# Patient Record
Sex: Female | Born: 1980 | Race: White | Hispanic: No | Marital: Married | Smoking: Former smoker
Health system: Southern US, Community
[De-identification: ages and names within clinical notes are randomized; demographics above are authoritative.]

## PROBLEM LIST (undated history)

## (undated) DIAGNOSIS — R0602 Shortness of breath: Secondary | ICD-10-CM

## (undated) DIAGNOSIS — G4730 Sleep apnea, unspecified: Secondary | ICD-10-CM

## (undated) DIAGNOSIS — D649 Anemia, unspecified: Secondary | ICD-10-CM

## (undated) DIAGNOSIS — F419 Anxiety disorder, unspecified: Secondary | ICD-10-CM

## (undated) DIAGNOSIS — K219 Gastro-esophageal reflux disease without esophagitis: Secondary | ICD-10-CM

## (undated) DIAGNOSIS — Z9884 Bariatric surgery status: Secondary | ICD-10-CM

## (undated) DIAGNOSIS — E669 Obesity, unspecified: Secondary | ICD-10-CM

## (undated) DIAGNOSIS — G2581 Restless legs syndrome: Secondary | ICD-10-CM

## (undated) DIAGNOSIS — K589 Irritable bowel syndrome without diarrhea: Secondary | ICD-10-CM

## (undated) DIAGNOSIS — R5383 Other fatigue: Secondary | ICD-10-CM

## (undated) DIAGNOSIS — E739 Lactose intolerance, unspecified: Secondary | ICD-10-CM

## (undated) DIAGNOSIS — K5900 Constipation, unspecified: Secondary | ICD-10-CM

## (undated) DIAGNOSIS — E559 Vitamin D deficiency, unspecified: Secondary | ICD-10-CM

## (undated) DIAGNOSIS — R600 Localized edema: Secondary | ICD-10-CM

## (undated) DIAGNOSIS — M549 Dorsalgia, unspecified: Secondary | ICD-10-CM

## (undated) DIAGNOSIS — T7840XA Allergy, unspecified, initial encounter: Secondary | ICD-10-CM

## (undated) DIAGNOSIS — E785 Hyperlipidemia, unspecified: Secondary | ICD-10-CM

## (undated) DIAGNOSIS — M2550 Pain in unspecified joint: Secondary | ICD-10-CM

## (undated) DIAGNOSIS — F32A Depression, unspecified: Secondary | ICD-10-CM

## (undated) HISTORY — DX: Localized edema: R60.0

## (undated) HISTORY — PX: LAPAROSCOPIC GASTRIC BAND REMOVAL WITH LAPAROSCOPIC GASTRIC SLEEVE RESECTION: SHX6498

## (undated) HISTORY — DX: Gastro-esophageal reflux disease without esophagitis: K21.9

## (undated) HISTORY — DX: Allergy, unspecified, initial encounter: T78.40XA

## (undated) HISTORY — DX: Constipation, unspecified: K59.00

## (undated) HISTORY — DX: Pain in unspecified joint: M25.50

## (undated) HISTORY — DX: Bariatric surgery status: Z98.84

## (undated) HISTORY — DX: Other fatigue: R53.83

## (undated) HISTORY — DX: Vitamin D deficiency, unspecified: E55.9

## (undated) HISTORY — PX: COLONOSCOPY WITH ESOPHAGOGASTRODUODENOSCOPY (EGD): SHX5779

## (undated) HISTORY — DX: Anxiety disorder, unspecified: F41.9

## (undated) HISTORY — DX: Depression, unspecified: F32.A

## (undated) HISTORY — DX: Lactose intolerance, unspecified: E73.9

## (undated) HISTORY — PX: WISDOM TOOTH EXTRACTION: SHX21

## (undated) HISTORY — DX: Irritable bowel syndrome, unspecified: K58.9

## (undated) HISTORY — PX: WRIST SURGERY: SHX841

## (undated) HISTORY — DX: Hyperlipidemia, unspecified: E78.5

## (undated) HISTORY — DX: Sleep apnea, unspecified: G47.30

## (undated) HISTORY — PX: TUBAL LIGATION: SHX77

## (undated) HISTORY — DX: Anemia, unspecified: D64.9

## (undated) HISTORY — PX: ENDOMETRIAL ABLATION W/ NOVASURE: SUR434

## (undated) HISTORY — DX: Dorsalgia, unspecified: M54.9

## (undated) HISTORY — DX: Shortness of breath: R06.02

---
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
## 2000-03-29 ENCOUNTER — Other Ambulatory Visit: Admission: RE | Admit: 2000-03-29 | Discharge: 2000-03-29 | Payer: Self-pay | Admitting: Obstetrics and Gynecology

## 2000-05-24 ENCOUNTER — Other Ambulatory Visit: Admission: RE | Admit: 2000-05-24 | Discharge: 2000-05-24 | Payer: Self-pay | Admitting: Obstetrics and Gynecology

## 2000-05-24 ENCOUNTER — Encounter (INDEPENDENT_AMBULATORY_CARE_PROVIDER_SITE_OTHER): Payer: Self-pay

## 2000-11-01 ENCOUNTER — Other Ambulatory Visit: Admission: RE | Admit: 2000-11-01 | Discharge: 2000-11-01 | Payer: Self-pay | Admitting: Obstetrics and Gynecology

## 2001-04-10 ENCOUNTER — Other Ambulatory Visit: Admission: RE | Admit: 2001-04-10 | Discharge: 2001-04-10 | Payer: Self-pay | Admitting: Obstetrics and Gynecology

## 2002-02-20 ENCOUNTER — Emergency Department (HOSPITAL_COMMUNITY): Admission: EM | Admit: 2002-02-20 | Discharge: 2002-02-21 | Payer: Self-pay | Admitting: Emergency Medicine

## 2002-02-21 ENCOUNTER — Encounter: Payer: Self-pay | Admitting: Emergency Medicine

## 2002-04-18 ENCOUNTER — Other Ambulatory Visit: Admission: RE | Admit: 2002-04-18 | Discharge: 2002-04-18 | Payer: Self-pay | Admitting: Obstetrics and Gynecology

## 2003-05-08 ENCOUNTER — Other Ambulatory Visit: Admission: RE | Admit: 2003-05-08 | Discharge: 2003-05-08 | Payer: Self-pay | Admitting: Obstetrics and Gynecology

## 2003-09-15 ENCOUNTER — Ambulatory Visit (HOSPITAL_COMMUNITY): Admission: RE | Admit: 2003-09-15 | Discharge: 2003-09-15 | Payer: Self-pay | Admitting: Obstetrics and Gynecology

## 2003-10-14 ENCOUNTER — Inpatient Hospital Stay (HOSPITAL_COMMUNITY): Admission: AD | Admit: 2003-10-14 | Discharge: 2003-10-15 | Payer: Self-pay | Admitting: Obstetrics and Gynecology

## 2003-12-16 ENCOUNTER — Inpatient Hospital Stay (HOSPITAL_COMMUNITY): Admission: AD | Admit: 2003-12-16 | Discharge: 2003-12-16 | Payer: Self-pay | Admitting: *Deleted

## 2004-01-02 ENCOUNTER — Inpatient Hospital Stay (HOSPITAL_COMMUNITY): Admission: AD | Admit: 2004-01-02 | Discharge: 2004-01-03 | Payer: Self-pay | Admitting: Obstetrics and Gynecology

## 2004-01-03 ENCOUNTER — Inpatient Hospital Stay (HOSPITAL_COMMUNITY): Admission: AD | Admit: 2004-01-03 | Discharge: 2004-01-03 | Payer: Self-pay | Admitting: Obstetrics and Gynecology

## 2004-01-06 ENCOUNTER — Inpatient Hospital Stay (HOSPITAL_COMMUNITY): Admission: AD | Admit: 2004-01-06 | Discharge: 2004-01-06 | Payer: Self-pay | Admitting: Obstetrics and Gynecology

## 2004-01-20 ENCOUNTER — Inpatient Hospital Stay (HOSPITAL_COMMUNITY): Admission: AD | Admit: 2004-01-20 | Discharge: 2004-01-21 | Payer: Self-pay | Admitting: Obstetrics and Gynecology

## 2004-12-27 ENCOUNTER — Other Ambulatory Visit: Admission: RE | Admit: 2004-12-27 | Discharge: 2004-12-27 | Payer: Self-pay | Admitting: Obstetrics and Gynecology

## 2006-05-02 ENCOUNTER — Emergency Department (HOSPITAL_COMMUNITY): Admission: EM | Admit: 2006-05-02 | Discharge: 2006-05-02 | Payer: Self-pay | Admitting: *Deleted

## 2006-08-01 ENCOUNTER — Emergency Department (HOSPITAL_COMMUNITY): Admission: EM | Admit: 2006-08-01 | Discharge: 2006-08-01 | Payer: Self-pay | Admitting: Family Medicine

## 2006-11-18 ENCOUNTER — Emergency Department (HOSPITAL_COMMUNITY): Admission: EM | Admit: 2006-11-18 | Discharge: 2006-11-18 | Payer: Self-pay | Admitting: Emergency Medicine

## 2007-02-08 ENCOUNTER — Emergency Department (HOSPITAL_COMMUNITY): Admission: EM | Admit: 2007-02-08 | Discharge: 2007-02-08 | Payer: Self-pay | Admitting: Emergency Medicine

## 2007-04-20 ENCOUNTER — Ambulatory Visit: Payer: Self-pay | Admitting: *Deleted

## 2007-04-20 ENCOUNTER — Encounter (INDEPENDENT_AMBULATORY_CARE_PROVIDER_SITE_OTHER): Payer: Self-pay | Admitting: Nurse Practitioner

## 2007-04-20 ENCOUNTER — Ambulatory Visit: Payer: Self-pay | Admitting: Family Medicine

## 2007-04-20 LAB — CONVERTED CEMR LAB
ALT: 15 units/L (ref 0–35)
AST: 13 units/L (ref 0–37)
Alkaline Phosphatase: 90 units/L (ref 39–117)
Basophils Absolute: 0 10*3/uL (ref 0.0–0.1)
Basophils Relative: 0 % (ref 0–1)
CO2: 22 meq/L (ref 19–32)
Calcium: 10 mg/dL (ref 8.4–10.5)
Chloride: 105 meq/L (ref 96–112)
Eosinophils Absolute: 0.1 10*3/uL (ref 0.0–0.7)
Eosinophils Relative: 1 % (ref 0–5)
Helicobacter Pylori Antibody-IgG: <0.4
MCHC: 33.5 g/dL (ref 30.0–36.0)
Neutro Abs: 6 10*3/uL (ref 1.7–7.7)
Potassium: 4.4 meq/L (ref 3.5–5.3)
Sodium: 139 meq/L (ref 135–145)
TSH: 1.287 microintl units/mL (ref 0.350–5.50)
WBC: 9 10*3/uL (ref 4.0–10.5)

## 2007-04-25 ENCOUNTER — Ambulatory Visit (HOSPITAL_COMMUNITY): Admission: RE | Admit: 2007-04-25 | Discharge: 2007-04-25 | Payer: Self-pay | Admitting: Family Medicine

## 2007-05-22 ENCOUNTER — Ambulatory Visit: Payer: Self-pay | Admitting: Internal Medicine

## 2007-05-24 ENCOUNTER — Ambulatory Visit: Payer: Self-pay | Admitting: Internal Medicine

## 2007-05-24 ENCOUNTER — Encounter (INDEPENDENT_AMBULATORY_CARE_PROVIDER_SITE_OTHER): Payer: Self-pay | Admitting: Nurse Practitioner

## 2007-05-29 ENCOUNTER — Encounter (INDEPENDENT_AMBULATORY_CARE_PROVIDER_SITE_OTHER): Payer: Self-pay | Admitting: Nurse Practitioner

## 2007-05-29 ENCOUNTER — Ambulatory Visit: Payer: Self-pay | Admitting: Family Medicine

## 2007-05-29 LAB — CONVERTED CEMR LAB
Total CHOL/HDL Ratio: 3.5
Triglycerides: 85 mg/dL (ref <150)

## 2007-06-14 ENCOUNTER — Ambulatory Visit: Payer: Self-pay | Admitting: Family Medicine

## 2007-07-03 ENCOUNTER — Ambulatory Visit: Payer: Self-pay | Admitting: Family Medicine

## 2007-07-03 DIAGNOSIS — R87619 Unspecified abnormal cytological findings in specimens from cervix uteri: Secondary | ICD-10-CM | POA: Insufficient documentation

## 2007-07-03 DIAGNOSIS — R8789 Other abnormal findings in specimens from female genital organs: Secondary | ICD-10-CM | POA: Insufficient documentation

## 2007-07-04 ENCOUNTER — Telehealth: Payer: Self-pay | Admitting: *Deleted

## 2007-07-11 ENCOUNTER — Ambulatory Visit: Payer: Self-pay | Admitting: Internal Medicine

## 2007-07-16 ENCOUNTER — Telehealth: Payer: Self-pay | Admitting: *Deleted

## 2007-07-16 ENCOUNTER — Encounter: Payer: Self-pay | Admitting: Family Medicine

## 2007-07-24 ENCOUNTER — Telehealth: Payer: Self-pay | Admitting: *Deleted

## 2007-08-03 ENCOUNTER — Telehealth: Payer: Self-pay | Admitting: *Deleted

## 2007-08-09 ENCOUNTER — Ambulatory Visit: Payer: Self-pay | Admitting: Internal Medicine

## 2007-08-14 ENCOUNTER — Emergency Department (HOSPITAL_COMMUNITY): Admission: EM | Admit: 2007-08-14 | Discharge: 2007-08-14 | Payer: Self-pay | Admitting: Family Medicine

## 2007-08-17 ENCOUNTER — Ambulatory Visit: Payer: Self-pay | Admitting: Obstetrics & Gynecology

## 2007-08-21 ENCOUNTER — Ambulatory Visit: Payer: Self-pay | Admitting: Internal Medicine

## 2007-09-05 ENCOUNTER — Other Ambulatory Visit: Admission: RE | Admit: 2007-09-05 | Discharge: 2007-09-05 | Payer: Self-pay | Admitting: Obstetrics & Gynecology

## 2007-09-05 ENCOUNTER — Ambulatory Visit: Payer: Self-pay | Admitting: Obstetrics & Gynecology

## 2007-10-05 ENCOUNTER — Ambulatory Visit: Payer: Self-pay | Admitting: *Deleted

## 2007-10-13 ENCOUNTER — Emergency Department (HOSPITAL_COMMUNITY): Admission: EM | Admit: 2007-10-13 | Discharge: 2007-10-13 | Payer: Self-pay | Admitting: Emergency Medicine

## 2007-10-16 ENCOUNTER — Ambulatory Visit (HOSPITAL_COMMUNITY): Admission: RE | Admit: 2007-10-16 | Discharge: 2007-10-16 | Payer: Self-pay | Admitting: Family Medicine

## 2007-10-21 ENCOUNTER — Emergency Department (HOSPITAL_COMMUNITY): Admission: EM | Admit: 2007-10-21 | Discharge: 2007-10-21 | Payer: Self-pay | Admitting: Family Medicine

## 2007-11-14 ENCOUNTER — Emergency Department (HOSPITAL_COMMUNITY): Admission: EM | Admit: 2007-11-14 | Discharge: 2007-11-14 | Payer: Self-pay | Admitting: Family Medicine

## 2007-12-08 ENCOUNTER — Emergency Department (HOSPITAL_COMMUNITY): Admission: EM | Admit: 2007-12-08 | Discharge: 2007-12-08 | Payer: Self-pay | Admitting: Family Medicine

## 2008-01-25 ENCOUNTER — Encounter: Payer: Self-pay | Admitting: Obstetrics & Gynecology

## 2008-01-25 ENCOUNTER — Ambulatory Visit: Payer: Self-pay | Admitting: Obstetrics & Gynecology

## 2008-01-31 ENCOUNTER — Ambulatory Visit: Payer: Self-pay | Admitting: Sports Medicine

## 2008-01-31 DIAGNOSIS — E669 Obesity, unspecified: Secondary | ICD-10-CM | POA: Insufficient documentation

## 2008-01-31 DIAGNOSIS — M25539 Pain in unspecified wrist: Secondary | ICD-10-CM | POA: Insufficient documentation

## 2008-02-01 ENCOUNTER — Encounter: Payer: Self-pay | Admitting: Sports Medicine

## 2008-02-01 ENCOUNTER — Ambulatory Visit: Payer: Self-pay | Admitting: Family Medicine

## 2008-02-01 LAB — CONVERTED CEMR LAB: Glucose, Bld: 95 mg/dL (ref 70–99)

## 2008-02-05 ENCOUNTER — Encounter (INDEPENDENT_AMBULATORY_CARE_PROVIDER_SITE_OTHER): Payer: Self-pay | Admitting: *Deleted

## 2008-02-08 ENCOUNTER — Encounter: Payer: Self-pay | Admitting: Family Medicine

## 2008-02-21 ENCOUNTER — Ambulatory Visit: Payer: Self-pay | Admitting: Obstetrics & Gynecology

## 2008-03-04 ENCOUNTER — Ambulatory Visit: Payer: Self-pay | Admitting: Sports Medicine

## 2008-03-04 DIAGNOSIS — M65839 Other synovitis and tenosynovitis, unspecified forearm: Secondary | ICD-10-CM | POA: Insufficient documentation

## 2008-03-04 DIAGNOSIS — G5600 Carpal tunnel syndrome, unspecified upper limb: Secondary | ICD-10-CM | POA: Insufficient documentation

## 2008-03-04 DIAGNOSIS — M65849 Other synovitis and tenosynovitis, unspecified hand: Secondary | ICD-10-CM

## 2008-04-04 ENCOUNTER — Ambulatory Visit: Payer: Self-pay | Admitting: Sports Medicine

## 2008-04-04 ENCOUNTER — Ambulatory Visit: Payer: Self-pay | Admitting: Family Medicine

## 2008-04-04 DIAGNOSIS — R609 Edema, unspecified: Secondary | ICD-10-CM | POA: Insufficient documentation

## 2008-04-04 LAB — CONVERTED CEMR LAB
Albumin: 3.9 g/dL (ref 3.5–5.2)
Alkaline Phosphatase: 73 units/L (ref 39–117)
BUN: 11 mg/dL (ref 6–23)
Basophils Relative: 0 % (ref 0–1)
Calcium: 9.5 mg/dL (ref 8.4–10.5)
Eosinophils Relative: 3 % (ref 0–5)
Glucose, Bld: 81 mg/dL (ref 70–99)
Hemoglobin: 12.5 g/dL (ref 12.0–15.0)
Lymphocytes Relative: 32 % (ref 12–46)
Lymphs Abs: 2.3 10*3/uL (ref 0.7–4.0)
MCV: 93.7 fL (ref 78.0–100.0)
Monocytes Absolute: 0.6 10*3/uL (ref 0.1–1.0)
Monocytes Relative: 8 % (ref 3–12)
Platelets: 288 10*3/uL (ref 150–400)
RBC: 4.12 M/uL (ref 3.87–5.11)
Sodium: 141 meq/L (ref 135–145)
Total Bilirubin: 0.2 mg/dL — ABNORMAL LOW (ref 0.3–1.2)

## 2008-05-06 ENCOUNTER — Telehealth: Payer: Self-pay | Admitting: Family Medicine

## 2008-05-07 ENCOUNTER — Ambulatory Visit: Payer: Self-pay | Admitting: Family Medicine

## 2008-05-07 ENCOUNTER — Encounter: Payer: Self-pay | Admitting: Family Medicine

## 2008-05-07 DIAGNOSIS — R300 Dysuria: Secondary | ICD-10-CM | POA: Insufficient documentation

## 2008-05-14 ENCOUNTER — Ambulatory Visit: Payer: Self-pay | Admitting: Family Medicine

## 2008-12-26 ENCOUNTER — Emergency Department (HOSPITAL_COMMUNITY): Admission: EM | Admit: 2008-12-26 | Discharge: 2008-12-26 | Payer: Self-pay | Admitting: Family Medicine

## 2009-02-15 ENCOUNTER — Inpatient Hospital Stay (HOSPITAL_COMMUNITY): Admission: AD | Admit: 2009-02-15 | Discharge: 2009-02-15 | Payer: Self-pay | Admitting: Obstetrics and Gynecology

## 2009-09-24 ENCOUNTER — Inpatient Hospital Stay (HOSPITAL_COMMUNITY): Admission: AD | Admit: 2009-09-24 | Discharge: 2009-09-25 | Payer: Self-pay | Admitting: Obstetrics and Gynecology

## 2009-10-05 ENCOUNTER — Inpatient Hospital Stay (HOSPITAL_COMMUNITY): Admission: AD | Admit: 2009-10-05 | Discharge: 2009-10-05 | Payer: Self-pay | Admitting: Obstetrics and Gynecology

## 2009-10-10 ENCOUNTER — Inpatient Hospital Stay (HOSPITAL_COMMUNITY): Admission: AD | Admit: 2009-10-10 | Discharge: 2009-10-10 | Payer: Self-pay | Admitting: Obstetrics and Gynecology

## 2009-10-14 ENCOUNTER — Inpatient Hospital Stay (HOSPITAL_COMMUNITY): Admission: AD | Admit: 2009-10-14 | Discharge: 2009-10-16 | Payer: Self-pay | Admitting: Obstetrics and Gynecology

## 2009-10-15 ENCOUNTER — Encounter (INDEPENDENT_AMBULATORY_CARE_PROVIDER_SITE_OTHER): Payer: Self-pay | Admitting: Obstetrics and Gynecology

## 2009-12-31 ENCOUNTER — Ambulatory Visit: Payer: Self-pay | Admitting: Sports Medicine

## 2009-12-31 DIAGNOSIS — M25519 Pain in unspecified shoulder: Secondary | ICD-10-CM | POA: Insufficient documentation

## 2010-01-15 ENCOUNTER — Ambulatory Visit: Payer: Self-pay | Admitting: Family Medicine

## 2010-01-15 DIAGNOSIS — M76899 Other specified enthesopathies of unspecified lower limb, excluding foot: Secondary | ICD-10-CM | POA: Insufficient documentation

## 2010-01-15 DIAGNOSIS — M533 Sacrococcygeal disorders, not elsewhere classified: Secondary | ICD-10-CM | POA: Insufficient documentation

## 2010-01-15 LAB — CONVERTED CEMR LAB
Glucose, Urine, Semiquant: NEGATIVE
Ketones, urine, test strip: NEGATIVE
Nitrite: NEGATIVE
Specific Gravity, Urine: >=1.03
WBC Urine, dipstick: NEGATIVE
pH: 5.5

## 2010-01-16 ENCOUNTER — Telehealth: Payer: Self-pay | Admitting: Family Medicine

## 2010-01-18 ENCOUNTER — Telehealth (INDEPENDENT_AMBULATORY_CARE_PROVIDER_SITE_OTHER): Payer: Self-pay | Admitting: *Deleted

## 2010-04-20 NOTE — Progress Notes (Signed)
Summary: RX request  Phone Note Call from Patient   Summary of Call: Pt. called and stated was in office yesterday and was told by Dr. Cathren Harsh to call if she developes a rash and itching, so that a RX can be called in for her. Per pt. "rash is on left hip", and "itching bad". Please advise. Pharmacy: [facility], [address] [place], [place]. # to reach pt. [phone]. Joanne Chars CMA  January 16, 2010 10:50 AM     New/Updated Medications: VALTREX 1 GM TABS (VALACYCLOVIR HCL) One by mouth q8hr for shingles Prescriptions: VALTREX 1 GM TABS (VALACYCLOVIR HCL) One by mouth q8hr for shingles  #21 x 0   Entered and Authorized by:   Donna Christen MD   Signed by:   Donna Christen MD on 01/16/2010   Method used:   Electronically to        [facility] [id number] (retail)       [address] [place], [place]  [address]       Ph: [phone]       Fax: [phone]   RxID:   [id number]  Rx sent Donna Christen MD  January 16, 2010 11:20 AM  Pt. informed. Joanne Chars CMA  January 16, 2010 12:35 PM

## 2010-04-20 NOTE — Assessment & Plan Note (Signed)
Summary: Additional lab testing  urinalysis (dipstick): unremarkable except 3+ blood (just started menses) Donna Christen MD  January 15, 2010 12:43 PM

## 2010-04-20 NOTE — Letter (Signed)
Summary: CONTROLLED MED RX POLICY  CONTROLLED MED RX POLICY   Imported By: Dannette Barbara 01/15/2010 15:16:52  _____________________________________________________________________  External Attachment:    Type:   Image     Comment:   External Document

## 2010-04-20 NOTE — Assessment & Plan Note (Signed)
Summary: LEFT HIP PAIN AND NUMBNESS (rm 5)   Vital Signs:  Patient Profile:   30 Years Old Female CC:      left hip pain and numbness x 2 days Height:     63 inches Weight:      189 pounds O2 Sat:      98 % O2 treatment:    Room Air Temp:     98.5 degrees F oral Pulse rate:   77 / minute Resp:     16 per minute BP sitting:   122 / 82  (left arm) Cuff size:   large  Pt. in pain?   yes    Location:   left hip    Intensity:   4    Type:       cramping  Vitals Entered By: Lajean Saver RN (January 15, 2010 8:06 AM)                   Updated Prior Medication List: FOLIC ACID 1 MG TABS (FOLIC ACID)   Current Allergies: ! VICODIN REQUIP (ROPINIROLE HCL)History of Present Illness Chief Complaint: left hip pain and numbness x 2 days History of Present Illness:  Subjective:  Patient complains of awakening with left flank, left lower back, and left hip pain 2 days ago.  She now has a burning, itching sensation in the area.  No radiation of pain to the left lower leg.  No bowel or bladder dysfunction.  No saddle numbness.  She states that she has had shingles in the same area in the past.  No recent injury or significant change in activities.  She admits that she has been using heels at work.  The pain is somewhat worse with movement and supine on her left side at night.  No fevers, chills, and sweats.  No GI symptoms. She has a young child, who she must lift and carry frequently.  REVIEW OF SYSTEMS Constitutional Symptoms      Denies fever, chills, night sweats, weight loss, weight gain, and fatigue.  Eyes       Denies change in vision, eye pain, eye discharge, glasses, contact lenses, and eye surgery. Ear/Nose/Throat/Mouth       Denies hearing loss/aids, change in hearing, ear pain, ear discharge, dizziness, frequent runny nose, frequent nose bleeds, sinus problems, sore throat, hoarseness, and tooth pain or bleeding.  Respiratory       Denies dry cough, productive cough,  wheezing, shortness of breath, asthma, bronchitis, and emphysema/COPD.  Cardiovascular       Denies murmurs, chest pain, and tires easily with exhertion.    Gastrointestinal       Denies stomach pain, nausea/vomiting, diarrhea, constipation, blood in bowel movements, and indigestion. Genitourniary       Denies painful urination, kidney stones, and loss of urinary control. Neurological       Complains of numbness.      Denies paralysis, seizures, and fainting/blackouts.      Comments: left hip Musculoskeletal       Complains of joint pain and joint stiffness.      Denies muscle pain, decreased range of motion, redness, swelling, muscle weakness, and gout.  Skin       Denies bruising, unusual mles/lumps or sores, and hair/skin or nail changes.  Psych       Denies mood changes, temper/anger issues, anxiety/stress, speech problems, depression, and sleep problems. Other Comments: Patient c/o left hip pain which she decscribes as cramping and left hip numbness,  numb to the touch x wednesday (2 days ago). She does not recall and incident that could have triggered this.    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 scheduled for q 4 month paps.any abnl will need further work up Tubal ligation  Family History: Reviewed history from 01/31/2008 and no changes required. 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  No adenopathy is present.   Lungs:  Clear to auscultation.  Breath sounds are equal.  Heart:  Regular  rate and rhythm without murmurs, rubs, or gallops.  Abdomen:  Nontender without masses or hepatosplenomegaly.  Bowel sounds are present.  No CVA or flank tenderness.   Back:  Good range of motion.  Can heel/toe walk and squat without difficulty.  Tenderness over the left SI joint.   Straight leg raising test is negative.  Sitting knee extension test is negative.  Strength and sensation in the lower extremities is normal.  Patellar are normal.  Left hip:  Distinct tenderness over the greater trochanter.  Palpation here with resisted lateral abduction of the hip recreates her pain. Skin:  No rash Assessment New Problems: TROCHANTERIC BURSITIS, LEFT (ICD-726.5) SACROILIAC JOINT DYSFUNCTION (ICD-724.6)  PATIENT APPEARS TO HAVE A LEFT TROCHANTERIC BURSITIS AND SI JOINT INFLAMMATION, BUT EARLY HERPES ZOSTER ALSO POSSIBLE.  Plan New Medications/Changes: TRAMADOL HCL 50 MG TABS (TRAMADOL HCL) One or two tabs by mouth hs as needed for pain  #20 x 0, 01/15/2010, Donna Christen MD NAPROXEN 500 MG TABS (NAPROXEN) One by mouth two times a day pc  #20 x 1, 01/15/2010, Donna Christen MD  New Orders: New Patient Level IV [phone] Planning Comments:   Begin naproxen, and analgesic at bedtime. Begin ice pack to painful areas several times daily.  Begin range of motion exercises ([employer] information and instruction patient handout given)  Recommend that she discontinue heels at work.  Avoid carrying her child on one side continuously. Call if rash develops on left hip/flank:  will call in Valtrex If not improving 2 or 3 weeks, follow-up with Dr. Carmelia Bake   The patient and/or caregiver has been counseled thoroughly with regard to medications prescribed including dosage, schedule, interactions, rationale for use, and possible side effects and they verbalize understanding.  Diagnoses and expected course of recovery discussed and will return if not improved as expected or if the condition worsens. Patient  and/or caregiver verbalized understanding.  Prescriptions: TRAMADOL HCL 50 MG TABS (TRAMADOL HCL) One or two tabs by mouth hs as needed for pain  #20 x 0   Entered and Authorized by:   Donna Christen MD   Signed by:   Donna Christen MD on 01/15/2010   Method used:   Print then Give to Patient   RxID:   [id number] NAPROXEN 500 MG TABS (NAPROXEN) One by mouth two times a day pc  #20 x 1   Entered and Authorized by:   Donna Christen MD   Signed by:   Donna Christen MD on 01/15/2010   Method used:  Print then Give to Patient   RxID:   [id number]   Orders Added: 1)  New Patient Level IV [99204]    Laboratory Results   Urine Tests  Date/Time Received: January 15, 2010 8:55 AM  Date/Time Reported: January 15, 2010 8:55 AM   Routine Urinalysis   Color: yellow Appearance: Cloudy Glucose: negative   (Normal Range: Negative) Bilirubin: negative   (Normal Range: Negative) Ketone: negative   (Normal Range: Negative) Spec. Gravity: >=1.030   (Normal Range: 1.003-1.035) Blood: 3+   (Normal Range: Negative) pH: 5.5   (Normal Range: 5.0-8.0) Protein: trace   (Normal Range: Negative) Urobilinogen: 0.2   (Normal Range: 0-1) Nitrite: negative   (Normal Range: Negative) Leukocyte Esterace: negative   (Normal Range: Negative)    Comments: Patient started her cycle today.

## 2010-04-20 NOTE — Progress Notes (Signed)
  Phone Note Outgoing Call Call back at [facility] Phone [phone]   Call placed by: Lajean Saver RN,  January 18, 2010 1:12 PM Call placed to: Patient Action Taken: Phone Call Completed Summary of Call: Callback: Patient reports that her hip is improving. She is taking the antiviral as prescribed.

## 2010-04-20 NOTE — Assessment & Plan Note (Signed)
Summary: SHOULDER PAIN,MC   Vital Signs:  Patient profile:   30 year old female Height:      63 inches Weight:      191 pounds Pulse rate:   89 / minute BP sitting:   126 / 85  (right arm)  Vitals Entered By: Rochele Pages RN (December 31, 2009 9:16 AM) CC: rt shoulder pain and popping x 6 months   Primary Provider:  Denny Levy MD  CC:  rt shoulder pain and popping x 6 months.  History of Present Illness: 29yo R-hand dominant female presents to clinic for evaluation of rt shoulder pain and popping that she has experienced x 6 months.  No injury or trauma. Recently had baby 74-months ago & typically carries baby & care seat on her right side. Shoulder aggrevated by overhead activity, washing hair, dressing.  Also aggrevated by computer use at work & with cleaning at home. Some occasional neck pain. Some numbness/tingling that radiates to tips of all fingers - has hx of carpal tunnel, but feels different than those symptoms. She has tried Ibuprofen, but this has not been helpful.  This is the only treatment she has tried up to this point. No night time pain. No previous shoulder issues.  Allergies: 1)  Requip (Ropinirole Hcl) PMH-FH-SH reviewed for relevance  Review of Systems      See HPI  Physical Exam  General:  Well-developed,well-nourished,in no acute distress; alert,appropriate and cooperative throughout examination Head:  normocephalic and atraumatic.   Eyes:  PERRLA, EOMI Lungs:  normal respiratory effort.   Msk:  C-SPINE: Normal ROM without pain.  No TTP midline & no palpable step offs.  Mild TTP along paraspinal muscles on right, no spasm noted.  TTP along right trapezius.  neg spurling's b/l  SHOULDERS: no gross deformity.  Full ROM bilaterally without pain today.  TTP along rt trap, otherwise no TTP along AC joint, bicipital groove.  Neg Hawkins/Neer, neg Speeds, neg Obriens.  Normal RTC strength bilaterally.  Normal scapular motion bilaterally.  Normal ROM of elbows  & wrists bilaterally without pain.  (+)Phalens at right wrist, neg Tinel's at carpal tunnel & elbow bilaterally. Pulses:  +2/4 radial b/l Neurologic:  sensation intact to light touch.   DTR +2/4 tricep, bicep, brachioradialis b/l   Impression & Recommendations:  Problem # 1:  SHOULDER PAIN, RIGHT (ICD-719.41) Assessment New - R shoulder pain - most consistant with shoulder strain & associated spasm of trapezius on right.  No signs of RTC injury or bursitis.  Likely related to computer work & carrying her newborn on her right side. - Performed several muscle energy techniques in the office today which helped her symptoms, also educated on several techinques she can do at home when symptoms flare. - May continue to use ibuprofen or aleve as needed - Recommended use of moist heat for 15-20 mins at least once daily.  Should not lay on heating pad b/c can potentially burn the skin - Should try to alternate which arms she carries baby & car seat.  Reviewed set-up of her work station & it seems to be ergonomically designed. - Should contact us if symptoms worsening or not improving - f/u prn  Her updated medication list for this problem includes:    Tramadol Hcl 50 Mg Tabs (Tramadol hcl) .Marland Kitchen... Take one tab by mouth q6 prn  Complete Medication List: 1)  Tramadol Hcl 50 Mg Tabs (Tramadol hcl) .... Take one tab by mouth q6 prn 2)  Klonopin  1 Mg Tabs (Clonazepam) .... 1/2 to 1 tab by mouth at bedtime for restless legs

## 2010-04-29 ENCOUNTER — Encounter: Payer: Self-pay | Admitting: *Deleted

## 2010-06-04 ENCOUNTER — Encounter: Payer: Self-pay | Admitting: Emergency Medicine

## 2010-06-04 ENCOUNTER — Inpatient Hospital Stay (INDEPENDENT_AMBULATORY_CARE_PROVIDER_SITE_OTHER)
Admission: RE | Admit: 2010-06-04 | Discharge: 2010-06-04 | Disposition: A | Discharge status: Home / Self Care | Payer: PRIVATE HEALTH INSURANCE | Source: Ambulatory Visit | Attending: Emergency Medicine | Admitting: Emergency Medicine

## 2010-06-04 DIAGNOSIS — M549 Dorsalgia, unspecified: Secondary | ICD-10-CM

## 2010-06-04 DIAGNOSIS — R319 Hematuria, unspecified: Secondary | ICD-10-CM

## 2010-06-04 LAB — CONVERTED CEMR LAB
Bilirubin Urine: NEGATIVE
Glucose, Urine, Semiquant: NEGATIVE
Nitrite: NEGATIVE
Protein, U semiquant: NEGATIVE
Specific Gravity, Urine: 1.02
Urobilinogen, UA: 0.2
WBC Urine, dipstick: NEGATIVE
pH: 7

## 2010-06-05 LAB — WET PREP, GENITAL: Trich, Wet Prep: NONE SEEN

## 2010-06-05 LAB — URINALYSIS, ROUTINE W REFLEX MICROSCOPIC
Ketones, ur: NEGATIVE mg/dL
Nitrite: NEGATIVE
Specific Gravity, Urine: 1.01 (ref 1.005–1.030)
Urobilinogen, UA: 0.2 mg/dL (ref 0.0–1.0)
pH: 6 (ref 5.0–8.0)

## 2010-06-05 LAB — CBC
Hemoglobin: 10.4 g/dL — ABNORMAL LOW (ref 12.0–15.0)
MCV: 92.6 fL (ref 78.0–100.0)
Platelets: 126 10*3/uL — ABNORMAL LOW (ref 150–400)
Platelets: 166 10*3/uL (ref 150–400)
Platelets: 171 10*3/uL (ref 150–400)
RBC: 3.18 MIL/uL — ABNORMAL LOW (ref 3.87–5.11)
RBC: 4 MIL/uL (ref 3.87–5.11)
RDW: 14.2 % (ref 11.5–15.5)
RDW: 14.2 % (ref 11.5–15.5)
WBC: 10.3 10*3/uL (ref 4.0–10.5)
WBC: 11.8 10*3/uL — ABNORMAL HIGH (ref 4.0–10.5)

## 2010-06-05 LAB — DIFFERENTIAL
Basophils Absolute: 0 10*3/uL (ref 0.0–0.1)
Basophils Relative: 0 % (ref 0–1)
Eosinophils Absolute: 0.1 10*3/uL (ref 0.0–0.7)
Eosinophils Relative: 1 % (ref 0–5)
Monocytes Absolute: 0.7 10*3/uL (ref 0.1–1.0)
Neutrophils Relative %: 76 % (ref 43–77)

## 2010-06-07 ENCOUNTER — Encounter: Payer: Self-pay | Admitting: Family Medicine

## 2010-06-07 ENCOUNTER — Ambulatory Visit (INDEPENDENT_AMBULATORY_CARE_PROVIDER_SITE_OTHER): Payer: PRIVATE HEALTH INSURANCE | Admitting: Family Medicine

## 2010-06-07 DIAGNOSIS — G5600 Carpal tunnel syndrome, unspecified upper limb: Secondary | ICD-10-CM

## 2010-06-08 NOTE — Assessment & Plan Note (Signed)
Summary: POSSIBLE UTI?    Vital Signs:  Patient Profile:   30 Years Old Female CC:      mid/lower back pain x 1 week possible hematuria Height:     63 inches Weight:      190 pounds O2 Sat:      99 % O2 treatment:    Room Air Temp:     98.9 degrees F oral Pulse rate:   92 / minute Resp:     14 per minute BP sitting:   130 / 86  (left arm) Cuff size:   large  Pt. in pain?   yes    Location:   back    Intensity:   5    Type:       dull  Vitals Entered By: Lajean Saver RN (June 04, 2010 3:56 PM)                   Updated Prior Medication List: FOLIC ACID 1 MG TABS (FOLIC ACID)  MINOCYCLINE HCL 50 MG CAPS (MINOCYCLINE HCL)   Current Allergies (reviewed today): ! VICODIN REQUIP (ROPINIROLE HCL)History of Present Illness Chief Complaint: mid/lower back pain x 1 week possible hematuria History of Present Illness: Pt complains of  7 days of bilat mid-lower back pain. Denies any direct trauma, but had been doing [employer] zumba exercise classes the week prior to pain starting. Pain is ache, dull, worse to twist at waist. Intensity 3/10 at rest, 5/10 to twist. No radiation, numbness, or weakness. LMP started 3 days ago at normal time, now on last day of period with decreased, scant menstrual flow. No vag d/c or pelvic pain. No dysuria or frequency.  No fever.  ? hematuria vs menstrual flow. No abdominal pain. No nausea. No vomiting. Denies chance of pregnancy.  Last uti 1 year ago.  REVIEW OF SYSTEMS Constitutional Symptoms      Denies fever, chills, night sweats, weight loss, weight gain, and fatigue.  Eyes       Denies change in vision, eye pain, eye discharge, glasses, contact lenses, and eye surgery. Ear/Nose/Throat/Mouth       Denies hearing loss/aids, change in hearing, ear pain, ear discharge, dizziness, frequent runny nose, frequent nose bleeds, sinus problems, sore throat, hoarseness, and tooth pain or bleeding.  Respiratory       Denies dry cough, productive  cough, wheezing, shortness of breath, asthma, bronchitis, and emphysema/COPD.  Cardiovascular       Denies murmurs, chest pain, and tires easily with exhertion.    Gastrointestinal       Denies stomach pain, nausea/vomiting, diarrhea, constipation, blood in bowel movements, and indigestion. Genitourniary       Denies painful urination, kidney stones, and loss of urinary control. Neurological       Denies paralysis, seizures, and fainting/blackouts. Musculoskeletal       Complains of muscle pain.      Denies joint pain, joint stiffness, decreased range of motion, redness, swelling, muscle weakness, and gout.      Comments: mid/lower back Skin       Denies bruising, unusual mles/lumps or sores, and hair/skin or nail changes.  Psych       Denies mood changes, temper/anger issues, anxiety/stress, speech problems, depression, and sleep problems. Other Comments: Patient c/o dull/ache mid/lower back pain x 1 week. No injury. ?hematuria   Past History:  Past Medical History: Reviewed history from 01/15/2010 and no changes required. had abnl pap in 2004 with reportedly normal colpo 3  months post partum- had epidural  Past Surgical History: Reviewed history from 01/15/2010 and no changes required. 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 scheduled for q 4 month paps.any abnl will need further work up Tubal ligation  Family History: Reviewed history from 01/31/2008 and no changes required. grandmother had ovarian cancer Mother with carpal tunnel Cousin with thyroid problems  Social History: quit smoking n 2007 Lives at home with husband and three children Goes to school for criminal justice and also works at [employer] Physical Exam General appearance: well developed, well nourished, no acute distress Head: normocephalic, atraumatic Oral/Pharynx: tongue normal, posterior pharynx without erythema or exudate Neck: neck supple,  trachea midline, no  masses Chest/Lungs: no rales, wheezes, or rhonchi bilateral, breath sounds equal without effort Heart: regular rate and  rhythm, no murmur Abdomen: soft, non-tender without obvious organomegaly Neurological:  non-focal Back: No CVAT. moderate bilat ttp over parathoracic mms. No rib ttp. SLR neg Skin: no obvious rashes or lesions Assessment New Problems: HEMATURIA UNSPECIFIED (ICD-599.70) BACK PAIN (ICD-724.5)  Likely mm strain. UA neg, except 2+ blood, likely from menstration.  Patient Education: Patient and/or caregiver instructed in the following: Ibuprofen prn.  Plan New Orders: T-Culture, Urine [[id number]] UA Dipstick w/o Micro (automated)  [81003] Est. Patient Level IV [14782] Planning Comments:   Discussed tx options. Send off urine cx to r/o uti. Tx pain with ibuprofen, heat and sxs care. Risks, benefits, alternatives discussed. Pt voiced understanding and agreement.  Follow Up: Follow up in 2-3 days if no improvement, Follow up with Primary Physician  The patient and/or caregiver has been counseled thoroughly with regard to medications prescribed including dosage, schedule, interactions, rationale for use, and possible side effects and they verbalize understanding.  Diagnoses and expected course of recovery discussed and will return if not improved as expected or if the condition worsens. Patient and/or caregiver verbalized understanding.   Orders Added: 1)  T-Culture, Urine [[id number]] 2)  UA Dipstick w/o Micro (automated)  [81003] 3)  Est. Patient Level IV [78469]    Laboratory Results   Urine Tests  Date/Time Received: June 04, 2010 4:00 PM  Date/Time Reported: June 04, 2010 4:00 PM   Routine Urinalysis   Color: yellow Appearance: Clear Glucose: negative   (Normal Range: Negative) Bilirubin: negative   (Normal Range: Negative) Ketone: trace (5)   (Normal Range: Negative) Spec. Gravity: 1.020   (Normal Range: 1.003-1.035) Blood: moderate   (Normal  Range: Negative) pH: 7.0   (Normal Range: 5.0-8.0) Protein: negative   (Normal Range: Negative) Urobilinogen: 0.2   (Normal Range: 0-1) Nitrite: negative   (Normal Range: Negative) Leukocyte Esterace: negative   (Normal Range: Negative)    Comments: patient finished menstrual cycle yesterday

## 2010-06-09 ENCOUNTER — Telehealth (INDEPENDENT_AMBULATORY_CARE_PROVIDER_SITE_OTHER): Payer: Self-pay | Admitting: *Deleted

## 2010-06-17 NOTE — Assessment & Plan Note (Signed)
Summary: CARPAL TUNNEL PAIN,MC  [phone]   Vital Signs:  Patient profile:   30 year old female BP sitting:   118 / 84  (left arm) CC: carpal tunnel r>L   Primary Care Provider:  Denny Levy MD  CC:  carpal tunnel r>L.  History of Present Illness: RIGHt wrist pain with hand numbness. had dx carpal tunnel in this wrist several years ago and was given brace--got much better. Recently had pregnancy and wrist pain /hand numbness returned. after delivery of child still symptomatic. has been using brace--it might be too bug now as she has lost a lot of weight--and not helping tingling numbness in right median nerve distribution, worse with typing (her job)  Allergies: 1)  ! Vicodin 2)  Requip (Ropinirole Hcl)  Physical Exam  General:  alert, well-developed, well-nourished, well-hydrated, and overweight-appearing.   Msk:  WRIST RIGHt FROM. Normal strength in wrist flexion and extension. Distally neurovascularly ibntact. + tinel. + Phalen at 15 seconds.   Impression & Recommendations:  Problem # 1:  CARPAL TUNNEL SYNDROME (ICD-354.0)  will try new wrist splint for 2-4 weeks. if not improving will rtc for consideration of injection. also discussed surgical options  Orders: Splint Wrist (Q4696)  Complete Medication List: 1)  Folic Acid 1 Mg Tabs (Folic acid) 2)  Minocycline Hcl 50 Mg Caps (Minocycline hcl)   Orders Added: 1)  Est. Patient Level III [29528] 2)  Splint Wrist [L3908]

## 2010-06-17 NOTE — Progress Notes (Signed)
  Phone Note Outgoing Call   Call placed by: Lajean Saver RN,  June 09, 2010 10:12 AM Call placed to: Patient Action Taken: Phone Call Completed Summary of Call: Callback: Patient reports she has improved but is still having some discomfort. Urine culture result given. Rx for Macrobid 100mg  two times a day x 7 days called into [facility]

## 2010-06-23 LAB — HCG, QUANTITATIVE, PREGNANCY: hCG, Beta Chain, Quant, S: 12379 m[IU]/mL — ABNORMAL HIGH (ref <5)

## 2010-08-03 NOTE — Group Therapy Note (Signed)
NAMEPECOLIA, MARANDO NO.:  [id number]   MEDICAL RECORD NO.:  [id number]          PATIENT TYPE:  WOC   LOCATION:  [facility]                   FACILITY:  [facility]   PHYSICIAN:  Karlton Lemon, MD      DATE OF BIRTH:  06/04/1980   DATE OF SERVICE:  10/05/2007                                  CLINIC NOTE   REASON FOR VISIT:  Followup results from a LEEP on September 05, 2007.   HISTORY OF PRESENT ILLNESS:  Ms. 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  She had a LEEP  performed by Dr. [name] Flood on September 05, 2007 showing high-grade squamous  intraepithelial lesion CIN-2, consistent with HPV.  The high-grade  dysplasia was present at the endocervical resection margin.  The patient  is here to follow up her results.   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  Dysplasia  was seen in the resection margin.  The patient has been counseled to  follow up in 3 months for repeat Pap and then Pap smears every 4 months  x2 after that for a total of 3 Pap smears in the next year.  The patient  understands this and plans to follow up for her Pap smear in 3 months.           ______________________________  Karlton Lemon, MD     NS/MEDQ  D:  10/05/2007  T:  10/05/2007  Job:  [id number]

## 2010-08-06 NOTE — H&P (Signed)
NAMEKRISTLE, WESCH NO.:  [id number]   MEDICAL RECORD NO.:  [id number]          PATIENT TYPE:  INP   LOCATION:  9128                          FACILITY:  [facility]   PHYSICIAN:  Janine Limbo, M.D.DATE OF BIRTH:  16-Nov-1980   DATE OF ADMISSION:  01/20/2004  DATE OF DISCHARGE:                                HISTORY & PHYSICAL   Ms. Tiburcio Pea is a 30 year old married white female, gravida 2, para 1-0-0-1,  at 34 weeks' gestation, who presents complaining of uterine contractions  every two to four minutes since about 10 p.m. this evening.  She denies any  leaking or vaginal bleeding.  She reports positive fetal movement.  She  denies any nausea, vomiting, headaches, or visual disturbances.  Her  pregnancy has been followed at [facility] by the certified  nurse midwife service and has been essentially uncomplicated though at risk  for:   1.  History of conception on the contraceptive patch.  2.  History of abnormal Paps.  3.  History of positive group B strep with the first pregnancy.  4.  Family history of NTDs.  5.  Possible exposure to Coxsackie virus during this pregnancy with normal      fetal growth on ultrasound but positive titers.  6.  Preterm uterine contractions with no cervical change.  7.  She would like an epidural for labor.   OBSTETRIC/GYNECOLOGIC HISTORY:  She is a gravida 2, para 1-0-0-1, who  delivered a viable female infant in April 2000, who weighed 7 pounds 2  ounces at 38 weeks' gestation following a 14-hour labor.  She had a second  degree laceration and no other complications.  She had a history of an  abnormal Pap in 2002 and colposcopy.  Subsequently her Paps have been  normal.   GENERAL MEDICAL HISTORY:  She is lactose-intolerant and reports having had  the usual childhood diseases and has a history of stomach spasms and had an  upper GI in 1992 that was normal but has been diagnosed with possible  irritable bowel syndrome.   She also reports occasional urinary tract  infections and a history of pyelonephritis several years ago.  She does  smoke about half a pack a day, and she fractured her left arm when she was 30  years old.  Her only surgical history was wisdom teeth removed.   FAMILY HISTORY:  Significant for father with bypass surgery and  hypertension, mother with varicosities and nutritional anemia secondary to  stomach stapling.  Paternal grandmother with rheumatoid arthritis and  ovarian cancer, now deceased.   GENETIC HISTORY:  Significant for maternal uncle born with spina bifida,  hydrocephalus, and paternal grandfather born with half of his forearm  missing.   SOCIAL HISTORY:  She is married to Velora Mediate, who is involved and  supportive.  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  They are of the [facility] faith.  They deny any illicit drug use or  alcohol with this pregnancy,  and she reports smoking about half a pack a  day.   PRENATAL LABORATORY DATA:  Her blood type is A positive, antibody screen is  negative.  Syphilis is nonreactive.  Rubella is positive.  Hepatitis B  surface antigen is negative.  HIV is nonreactive.  GC and Chlamydia are both  negative.  Pap is within normal limits, and cystic fibrosis is negative.  Her quad screen was normal.  Her one-hour Glucola was within normal limits.  Her 36-week beta strep was negative.   PHYSICAL EXAMINATION:  VITAL SIGNS:  Stable.  She is afebrile.  HEENT:  Grossly within normal limits.  CARDIAC:  Her heart is regular rhythm and rate.  CHEST:  Clear.  BREASTS:  Soft and nontender.  ABDOMEN:  Gravid with uterine contractions that are every two to four  minutes.  Her fetal heart rate is reactive and reassuring.  PELVIC:  4.5 cm, 80%, vertex at -1.  EXTREMITIES:  Within normal limits.   ASSESSMENT:  1.  Intrauterine pregnancy at 28 weeks'  gestation.  2.  Early active labor.  3.  Negative group B strep.   PLAN:  1.  Admit to labor and delivery.  2.  Follow routine CNM orders.  3.  Plan artificial rupture of the membranes if needed and epidural as her      discomfort increases.  4.  Dr. Stefano Gaul is notified of the patient's admission.     Shel   SJD/MEDQ  D:  01/20/2004  T:  01/20/2004  Job:  [id number]

## 2010-12-16 LAB — POCT PREGNANCY, URINE
Operator id: 297281
Preg Test, Ur: NEGATIVE

## 2010-12-17 LAB — POCT PREGNANCY, URINE: Preg Test, Ur: NEGATIVE

## 2010-12-20 LAB — POCT URINALYSIS DIP (DEVICE)
Glucose, UA: NEGATIVE
Hgb urine dipstick: NEGATIVE
Protein, ur: NEGATIVE
Specific Gravity, Urine: 1.02
Urobilinogen, UA: 0.2
pH: 6

## 2012-01-25 ENCOUNTER — Ambulatory Visit: Payer: Self-pay | Admitting: Obstetrics and Gynecology

## 2012-02-15 ENCOUNTER — Encounter: Payer: Self-pay | Admitting: Obstetrics and Gynecology

## 2012-02-15 ENCOUNTER — Ambulatory Visit (INDEPENDENT_AMBULATORY_CARE_PROVIDER_SITE_OTHER): Payer: PRIVATE HEALTH INSURANCE | Admitting: Obstetrics and Gynecology

## 2012-02-15 VITALS — BP 110/82 | Wt 198.0 lb

## 2012-02-15 DIAGNOSIS — Z01419 Encounter for gynecological examination (general) (routine) without abnormal findings: Secondary | ICD-10-CM

## 2012-02-15 NOTE — Progress Notes (Signed)
Regular Periods: yes Mammogram: no  Monthly Breast Ex.: no Exercise: yes  Tetanus < 10 years: no Seatbelts: yes  NI. Bladder Functn.: yes Abuse at home: no  Daily BM's: yes Stressful Work: no  Healthy Diet: yes Sigmoid-Colonoscopy: pt had never had one   Calcium: yes Medical problems this year: none   LAST PAP:01/11/2011  Contraception: BTL  [facility]   PMH: unchanged  FMH: unchanged

## 2012-02-15 NOTE — Progress Notes (Signed)
Subjective:    Kara Peters [place] is a 31 y.o. female, G3P3003, who presents for an annual exam.   Patient reports:  Doing well, no issues.    History   Social History  . Marital Status: Single    Spouse Name: N/A    Number of Children: N/A  . Years of Education: N/A   Social History Main Topics  . Smoking status: Never Smoker   . Smokeless tobacco: Never Used  . Alcohol Use: None  . Drug Use: No  . Sexually Active: Yes    Birth Control/ Protection: Surgical     Comment: BTL    Other Topics Concern  . None   Social History Narrative  . None    Menstrual cycle:   LMP: Patient's last menstrual period was 01/20/2012.           Cycle: Normal  The following portions of the patient's history were reviewed and updated as appropriate: allergies, current medications, past family history, past medical history, past social history, past surgical history and problem list.  Review of Systems Pertinent items are noted in HPI. Breast:Negative for breast lump,nipple discharge or nipple retraction Gastrointestinal: Negative for abdominal pain, change in bowel habits or rectal bleeding Urinary:negative   Objective:    BP 110/82  Wt 198 lb (89.812 kg)  LMP 01/20/2012    Weight:  Wt Readings from Last 1 Encounters:  02/15/12 198 lb (89.812 kg)          BMI: There is no height on file to calculate BMI.  General Appearance: Alert, appropriate appearance for age. No acute distress HEENT: Grossly normal Neck / Thyroid: Supple, no masses, nodes or enlargement Lungs: clear to auscultation bilaterally Back: No CVA tenderness Breast Exam: No masses or nodes.No dimpling, nipple retraction or discharge. Cardiovascular: Regular rate and rhythm. S1, S2, no murmur Gastrointestinal: Soft, non-tender, no masses or organomegaly Pelvic Exam: Vulva and vagina appear normal. Bimanual exam reveals normal uterus and adnexa. Rectovaginal: normal rectal, no masses Lymphatic Exam: Non-palpable nodes  in neck, clavicular, axillary, or inguinal regions Skin: no rash or abnormalities Neurologic: Normal gait and speech, no tremor  Psychiatric: Alert and oriented, appropriate affect.   Wet Prep:not applicable Urinalysis:not applicable UPT: Not done   Assessment:    Normal gyn exam    Plan:    Mammogram: Age 77 Pap:  Done today STD screening: declined Contraception:bilateral tubal ligation Other:  NA      [name], [place], [place]

## 2012-02-16 LAB — PAP IG W/ RFLX HPV ASCU

## 2012-05-05 ENCOUNTER — Other Ambulatory Visit: Payer: Self-pay

## 2013-01-24 ENCOUNTER — Other Ambulatory Visit: Payer: Self-pay

## 2013-10-11 ENCOUNTER — Ambulatory Visit (INDEPENDENT_AMBULATORY_CARE_PROVIDER_SITE_OTHER): Payer: BC Managed Care – PPO

## 2013-10-11 ENCOUNTER — Encounter: Payer: Self-pay | Admitting: Podiatrist

## 2013-10-11 ENCOUNTER — Ambulatory Visit (INDEPENDENT_AMBULATORY_CARE_PROVIDER_SITE_OTHER): Payer: BC Managed Care – PPO | Admitting: Podiatrist

## 2013-10-11 VITALS — BP 141/87 | HR 84 | Resp 13 | Ht 62.0 in | Wt 205.0 lb

## 2013-10-11 DIAGNOSIS — M2040 Other hammer toe(s) (acquired), unspecified foot: Secondary | ICD-10-CM

## 2013-10-11 DIAGNOSIS — L309 Dermatitis, unspecified: Secondary | ICD-10-CM

## 2013-10-11 DIAGNOSIS — L259 Unspecified contact dermatitis, unspecified cause: Secondary | ICD-10-CM

## 2013-10-11 DIAGNOSIS — M2141 Flat foot [pes planus] (acquired), right foot: Secondary | ICD-10-CM

## 2013-10-11 DIAGNOSIS — M2142 Flat foot [pes planus] (acquired), left foot: Secondary | ICD-10-CM

## 2013-10-11 DIAGNOSIS — M2140 Flat foot [pes planus] (acquired), unspecified foot: Secondary | ICD-10-CM

## 2013-10-11 DIAGNOSIS — M7750 Other enthesopathy of unspecified foot: Secondary | ICD-10-CM

## 2013-10-11 DIAGNOSIS — M659 Synovitis and tenosynovitis, unspecified: Secondary | ICD-10-CM

## 2013-10-11 MED ORDER — BETAMETHASONE DIPROPIONATE 0.05 % EX OINT: TOPICAL_OINTMENT | Freq: Two times a day (BID) | CUTANEOUS | Status: DC

## 2013-10-11 NOTE — Progress Notes (Signed)
   Subjective:    Patient ID: Kara Peters, female    DOB: 17-May-1980, 33 y.o.   MRN: [id number]  HPI Comments: Pt states she has flat feet and her feet ache all the time, this began 2 - 3 months ago.  Pt states she has soaked in Epsom salt as treatment.    Pt states her 4th toes roll beneath the 3rd toes and are painful.  Pt states she has an eczema type rash on the bottom of her left foot, and occasionally itches.     Review of Systems  HENT: Positive for ear pain and sneezing.   Eyes: Positive for itching.  Musculoskeletal: Positive for arthralgias and myalgias.  Skin: Positive for rash.  Allergic/Immunologic: Positive for environmental allergies.  Hematological: Bruises/bleeds easily.  All other systems reviewed and are negative.      Objective:   Physical Exam  Patient is awake, alert, and oriented x 3.  In no acute distress.  Vascular status is intact with palpable pedal pulses at 2/4 DP and PT bilateral and capillary refill time within normal limits. Neurological sensation is also intact bilaterally via Semmes Weinstein monofilament at 5/5 sites. Light touch, vibratory sensation, Achilles tendon reflex is intact. Dermatological exam reveals skin color, turger and texture as normal. No open lesions present.  Musculature intact with dorsiflexion, plantarflexion, inversion, eversion.  Moderate pes planovalgus deformity is noted bilateral. Elongated arches are seen. Pronatory changes are seen with standing as well. Mild hammertoe deformity of the fourth toe bilaterally is noted and her mom has the same problem of which have fixed her toes in the past as well. At today's visit the toes or not painful or symptomatic and therefore we will watch them closely. Very small bumps consistent with eczema are noted bilateral arches. They are clear and itchy.    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 We will check her insurance to see if they will cover 2 pairs of orthotics as well. I'll see her back when orthotics her work ready for dispensing. Prescription for betamethasone was called in as well.

## 2013-10-11 NOTE — Patient Instructions (Signed)
Flat Feet Having flat feet is a common condition. One foot or both might be affected. People of any age can have flat feet. In fact, everyone is born with them. But most of the time, the foot gradually develops an arch. That is the curve on the bottom of the foot that creates a gap between the foot and the ground. An arch usually develops in childhood. Sometimes, though, an arch never develops and the foot stays flat on the bottom. Other times, an arch develops but later collapses (caves in). That is what gives the condition its nickname, "fallen arches." The medical term for flat feet is pes planus. Some people have flat feet their whole life and have no problems. For others, the condition causes pain and needs to be corrected.  CAUSES   A problem with the foot's soft tissue; tendons and ligaments could be loose.  This can cause what is called flexible flat feet. That means the shape of the foot changes with pressure. When standing on the toes, a curved arch can be seen. When standing on the ground, the foot is flat.  Wear and tear. Sometimes arches simply flatten over time.  Damage to the posterior tibial tendon. This is the tendon that goes from the inside of the ankle to the bones in the middle of the foot. It is the main support for the arch. If the tendon is injured, stretched or torn, the arch might flatten.  Tarsal coalition. With this condition, two or more bones in the foot are joined together (fused ) during development in the womb. This limits movement and can lead to a flat foot. SYMPTOMS   The foot is even with the ground from toe to heel. Your caregiver will look closely at the inside of the foot while you are standing.  Pain along the bottom of the foot. Some people describe the pain as tightness.  Swelling on the inside of the foot or ankle.  Changes in the way you walk (gait).  The feet lean inward, starting at the ankle (pronation). DIAGNOSIS  To decide if a child or  adult has flat feet, a healthcare provider will probably:  Do a physical examination. This might include having the person stand on his or her toes and then stand normally. The caregiver will also hold the foot and put pressure on the foot in different directions.  Check the person's shoes. The pattern of wear on the soles can offer clues.  Order images (pictures) of the foot. They can help identify the cause of any pain. They also will show injuries to bones or tendons that could be causing the condition. The images can come from:  X-rays.  Computed tomography (CT) scan. This combines X-ray and a computer.  Magnetic resonance imaging (MRI). This uses magnets, radio waves and a computer to take a picture of the foot. It is the best technique to evaluate tendons, ligaments and muscles. TREATMENT   Flexible flat feet usually are painless. Most of the time, gait is not affected. Most children grow out of the condition. Often no treatment is needed. If there is pain, treatment options include:  Orthotics. These are inserts that go in the shoes. They add support and shape to the feet. An orthotic is custom-made from a mold of the foot.  Shoes. Not all shoes are the same. People with flat feet need arch support. However, too much can be painful. It is important to find shoes that offer the right amount   of support. Athletes, especially runners, may need to try shoes made just for people with flatter feet.  Medication. For pain, only take over-the-counter medicine for pain, discomfort, as directed by your caregiver.  Rest. If the feet start to hurt, cut back on the exercise which increases the pain. Use common sense.  For damage to the posterior tibial tendon, options include:  Orthotics. Also adding a wedge on the inside edge may help. This can relieve pressure on the tendon.  Ankle brace, boot or cast. These supports can ease the load on the tendon while it heals.  Surgery. If the tendon is  torn, it might need to be repaired.  For tarsal coalition, similar options apply:  Pain medication.  Orthotics.  A cast and crutches. This keeps weight off the foot.  Physical therapy.  Surgery to remove the bone bridge joining the two bones together. PROGNOSIS  In most people, flat feet do not cause pain or problems. People can go about their normal activities. However, if flat feet are painful, they can and should be treated. Treatment usually relieves the pain. HOME CARE INSTRUCTIONS   Take any medications prescribed by the healthcare provider. Follow the directions carefully.  Wear, or make sure a child wears, orthotics or special shoes if this was suggested. Be sure to ask how often and for how long they should be worn.  Do any exercises or therapy treatments that were suggested.  Take notes on when the pain occurs. This will help healthcare providers decide how to treat the condition.  If surgery is needed, be sure to find out if there is anything that should or should not be done before the operation. SEEK MEDICAL CARE IF:   Pain worsens in the foot or lower leg.  Pain disappears after treatment, but then returns.  Walking or simple exercise becomes difficult or causes foot pain.  Orthotics or special shoes are uncomfortable or painful. Document Released: 01/02/2009 Document Revised: 05/30/2011 Document Reviewed: 01/02/2009 ExitCare Patient Information 2015 [employer]. This information is not intended to replace advice given to you by your health care provider. Make sure you discuss any questions you have with your health care provider.  

## 2013-11-01 ENCOUNTER — Other Ambulatory Visit: Payer: BC Managed Care – PPO

## 2013-11-29 ENCOUNTER — Ambulatory Visit: Payer: BC Managed Care – PPO

## 2013-11-29 DIAGNOSIS — M7750 Other enthesopathy of unspecified foot: Secondary | ICD-10-CM

## 2013-11-29 NOTE — Patient Instructions (Signed)

## 2013-11-29 NOTE — Progress Notes (Signed)
Pt is here to PUO 

## 2014-01-20 ENCOUNTER — Encounter: Payer: Self-pay | Admitting: Podiatrist

## 2017-11-17 ENCOUNTER — Telehealth: Payer: Self-pay | Admitting: Interventional Cardiology

## 2017-11-17 NOTE — Telephone Encounter (Signed)
Follow up:    Platient returning call back

## 2017-11-17 NOTE — Telephone Encounter (Signed)
Spoke with pt and scheduled her as a new pt with Dr. Katrinka BlazingSmith on 9/12.  Pt appreciative for call.

## 2017-11-29 DIAGNOSIS — R0609 Other forms of dyspnea: Secondary | ICD-10-CM | POA: Insufficient documentation

## 2017-11-29 DIAGNOSIS — R079 Chest pain, unspecified: Secondary | ICD-10-CM | POA: Insufficient documentation

## 2017-11-29 NOTE — Progress Notes (Signed)
Cardiology Office Note:    Date:  11/30/2017   ID:  Kara Peters, DOB February 07, 1981, MRN [id number]  PCP:  Christella Scheuermann, PA-C  Cardiologist:  No primary care provider on file.   Referring MD: Mitzi Hansen, NP   Chief Complaint  Patient presents with  . Chest Pain  . Shortness of Breath    History of Present Illness:    Kara Peters is a 37 y.o. female with a hx of  Exertional chest pain and dyspnea by Mitzi Hansen , NP.  Overall, she is doing relatively well but does note that physical activity such as walking up inclines and walking distances quickly cause pressure in the chest and shortness of breath.  This is happened on multiple occasions over the past 3 months.  She has never had an episode to occur at rest.  She denies orthopnea, PND, palpitations, syncope, lower extremity edema.  Risk factors include obesity, premature coronary atherosclerosis family history (father), prior history of smoking (discontinued in 2007) and elevated blood pressure but no diagnosis of hypertension.  Most recent LDL was 97 with a total cholesterol of 184  Past Medical History:  Diagnosis Date  . Allergy   . Anxiety     Past Surgical History:  Procedure Laterality Date  . TUBAL LIGATION    . WISDOM TOOTH EXTRACTION    . WRIST SURGERY      Current Medications: Current Meds  Medication Sig  . CALCIUM PO Take 1 tablet by mouth daily.  . Cholecalciferol (VITAMIN D) 2000 units CAPS Take 2,000 Units by mouth daily.  Marland Kitchen FIBER PO Take one (1) capsule by mouth each morning and two (2) capsule by mouth each evening.  . loratadine (CLARITIN) 10 MG tablet Take 10 mg by mouth daily.  Marland Kitchen rOPINIRole (REQUIP) 1 MG tablet Take 1 mg by mouth at bedtime.  Marland Kitchen venlafaxine XR (EFFEXOR-XR) 75 MG 24 hr capsule Take 75 mg by mouth every morning.     Allergies:   Nsaids   Social History   Socioeconomic History  . Marital status: Single    Spouse name: Not on file  . Number of children: Not on file    . Years of education: Not on file  . Highest education level: Not on file  Occupational History  . Not on file  Social Needs  . Financial resource strain: Not on file  . Food insecurity:    Worry: Not on file    Inability: Not on file  . Transportation needs:    Medical: Not on file    Non-medical: Not on file  Tobacco Use  . Smoking status: Former Smoker    Types: Cigarettes    Last attempt to quit: 03/21/2005    Years since quitting: 12.7  . Smokeless tobacco: Never Used  Substance and Sexual Activity  . Alcohol use: Not on file  . Drug use: No  . Sexual activity: Yes    Birth control/protection: Surgical    Comment: BTL   Lifestyle  . Physical activity:    Days per week: Not on file    Minutes per session: Not on file  . Stress: Not on file  Relationships  . Social connections:    Talks on phone: Not on file    Gets together: Not on file    Attends religious service: Not on file    Active member of club or organization: Not on file    Attends meetings of clubs or organizations:  Not on file    Relationship status: Not on file  Other Topics Concern  . Not on file  Social History Narrative  . Not on file     Family History: The patient's family history includes Arthritis in her mother; Cancer in her father; Heart disease in her father and paternal grandfather; Hypertension in her father and mother; Other in her father; Ovarian cancer in her paternal grandmother.  ROS:   Please see the history of present illness.    Prior history of sleep apnea.  States that sleep apnea went away after her gastric bypass/sleeve surgery.  Excessive sweating, occasional PND, dizziness, all other systems reviewed and are negative.  EKGs/Labs/Other Studies Reviewed:    The following studies were reviewed today:   EKG:  EKG is  ordered today.  The ekg ordered today demonstrates sinus rhythm with normal overall appearance.  Recent Labs: No results found for requested labs within  last 8760 hours.  Recent Lipid Panel    Component Value Date/Time   CHOL 188 05/29/2007 2057   TRIG 85 05/29/2007 2057   HDL 53 05/29/2007 2057   CHOLHDL 3.5 Ratio 05/29/2007 2057   VLDL 17 05/29/2007 2057   LDLCALC 118 (H) 05/29/2007 2057    Physical Exam:    VS:  BP 132/90   Pulse 97   Ht 5\' 2"  (1.575 m)   Wt 230 lb 6.4 oz (104.5 kg)   BMI 42.14 kg/m     Wt Readings from Last 3 Encounters:  11/30/17 230 lb 6.4 oz (104.5 kg)  10/11/13 205 lb (93 kg)  02/15/12 198 lb (89.8 kg)     GEN: Mild to moderate obesity.  Well nourished, well developed in no acute distress HEENT: Normal NECK: No JVD. LYMPHATICS: No lymphadenopathy CARDIAC: RRR, no murmur, no gallop, no edema. VASCULAR: 2+ bilateral radial and dorsalis pedis pulses.  No bruits. RESPIRATORY:  Clear to auscultation without rales, wheezing or rhonchi  ABDOMEN: Soft, non-tender, non-distended, No pulsatile mass, MUSCULOSKELETAL: No deformity  SKIN: Warm and dry NEUROLOGIC:  Alert and oriented x 3 PSYCHIATRIC:  Normal affect   ASSESSMENT:    1. Dyspnea on exertion   2. Exertional chest pain   3. Edema, unspecified type    PLAN:    In order of problems listed above:  1. Rule out ischemia mediated versus significant blood pressure elevation with exercise versus physical deconditioning. 2. Same 3. Not currently present  Exercise treadmill test will be performed to exclude CAD, measure exertional tolerance and blood pressure.  Further action will be dependent upon findings.  The stress test should achieve 100% of maximum heart rate and if possible last long enough to produce dyspnea and chest tightness assuming no obvious EKG changes are extremely elevated blood pressures.    Medication Adjustments/Labs and Tests Ordered: Current medicines are reviewed at length with the patient today.  Concerns regarding medicines are outlined above.  No orders of the defined types were placed in this encounter.  No orders  of the defined types were placed in this encounter.   There are no Patient Instructions on file for this visit.   Signed, Lesleigh Noe, MD  11/30/2017 11:30 AM    [facility]

## 2017-11-30 ENCOUNTER — Ambulatory Visit: Payer: BLUE CROSS/BLUE SHIELD | Admitting: Interventional Cardiology

## 2017-11-30 ENCOUNTER — Encounter: Payer: Self-pay | Admitting: Interventional Cardiology

## 2017-11-30 VITALS — BP 132/90 | HR 97 | Ht 62.0 in | Wt 230.4 lb

## 2017-11-30 DIAGNOSIS — R609 Edema, unspecified: Secondary | ICD-10-CM | POA: Diagnosis not present

## 2017-11-30 DIAGNOSIS — R079 Chest pain, unspecified: Secondary | ICD-10-CM

## 2017-11-30 DIAGNOSIS — R0609 Other forms of dyspnea: Secondary | ICD-10-CM | POA: Diagnosis not present

## 2017-11-30 NOTE — Patient Instructions (Signed)
Medication Instructions:  Your physician recommends that you continue on your current medications as directed. Please refer to the Current Medication list given to you today.  Labwork: None  Testing/Procedures: Your physician has requested that you have an exercise tolerance test. For further information please visit [email or web address]. Please also follow instruction sheet, as given.   Follow-Up: Your physician recommends that you schedule a follow-up appointment as needed with Dr. Smith.    Any Other Special Instructions Will Be Listed Below (If Applicable).     If you need a refill on your cardiac medications before your next appointment, please call your pharmacy.   

## 2017-12-06 ENCOUNTER — Ambulatory Visit (INDEPENDENT_AMBULATORY_CARE_PROVIDER_SITE_OTHER): Payer: BLUE CROSS/BLUE SHIELD

## 2017-12-06 DIAGNOSIS — R079 Chest pain, unspecified: Secondary | ICD-10-CM | POA: Diagnosis not present

## 2017-12-06 DIAGNOSIS — R0609 Other forms of dyspnea: Secondary | ICD-10-CM

## 2017-12-06 LAB — EXERCISE TOLERANCE TEST
CHL CUP MPHR: 183 {beats}/min
CHL RATE OF PERCEIVED EXERTION: 15
CSEPHR: 94 %
Estimated workload: 9 METS
Exercise duration (min): 7 min
Exercise duration (sec): 22 s
Peak HR: 173 {beats}/min
Rest HR: 89 {beats}/min

## 2018-02-19 ENCOUNTER — Other Ambulatory Visit: Payer: Self-pay

## 2018-02-19 ENCOUNTER — Encounter (HOSPITAL_BASED_OUTPATIENT_CLINIC_OR_DEPARTMENT_OTHER): Payer: Self-pay | Admitting: Emergency Medicine

## 2018-02-19 ENCOUNTER — Emergency Department (HOSPITAL_BASED_OUTPATIENT_CLINIC_OR_DEPARTMENT_OTHER)
Admission: EM | Admit: 2018-02-19 | Discharge: 2018-02-19 | Disposition: A | Discharge status: Home / Self Care | Payer: BLUE CROSS/BLUE SHIELD | Attending: Emergency Medicine | Admitting: Emergency Medicine

## 2018-02-19 DIAGNOSIS — Z79899 Other long term (current) drug therapy: Secondary | ICD-10-CM | POA: Diagnosis not present

## 2018-02-19 DIAGNOSIS — Z87891 Personal history of nicotine dependence: Secondary | ICD-10-CM | POA: Insufficient documentation

## 2018-02-19 DIAGNOSIS — R197 Diarrhea, unspecified: Secondary | ICD-10-CM | POA: Insufficient documentation

## 2018-02-19 DIAGNOSIS — R112 Nausea with vomiting, unspecified: Secondary | ICD-10-CM | POA: Diagnosis present

## 2018-02-19 HISTORY — DX: Restless legs syndrome: G25.81

## 2018-02-19 HISTORY — DX: Obesity, unspecified: E66.9

## 2018-02-19 LAB — COMPREHENSIVE METABOLIC PANEL
ALBUMIN: 4 g/dL (ref 3.5–5.0)
ALT: 19 U/L (ref 0–44)
ANION GAP: 9 (ref 5–15)
AST: 18 U/L (ref 15–41)
Alkaline Phosphatase: 78 U/L (ref 38–126)
BUN: 15 mg/dL (ref 6–20)
CALCIUM: 8.4 mg/dL — AB (ref 8.9–10.3)
CO2: 22 mmol/L (ref 22–32)
Chloride: 101 mmol/L (ref 98–111)
Creatinine, Ser: 0.89 mg/dL (ref 0.44–1.00)
GFR calc non Af Amer: >60 mL/min (ref >60)
GLUCOSE: 109 mg/dL — AB (ref 70–99)
POTASSIUM: 3.2 mmol/L — AB (ref 3.5–5.1)
SODIUM: 132 mmol/L — AB (ref 135–145)
Total Bilirubin: 0.6 mg/dL (ref 0.3–1.2)
Total Protein: 6.9 g/dL (ref 6.5–8.1)

## 2018-02-19 LAB — CBC WITH DIFFERENTIAL/PLATELET
ABS IMMATURE GRANULOCYTES: 0.03 10*3/uL (ref 0.00–0.07)
BASOS ABS: 0 10*3/uL (ref 0.0–0.1)
BASOS PCT: 0 %
Eosinophils Absolute: 0 10*3/uL (ref 0.0–0.5)
Eosinophils Relative: 0 %
HEMATOCRIT: 38.5 % (ref 36.0–46.0)
Hemoglobin: 12.5 g/dL (ref 12.0–15.0)
IMMATURE GRANULOCYTES: 0 %
LYMPHS ABS: 0.8 10*3/uL (ref 0.7–4.0)
Lymphocytes Relative: 8 %
MCH: 29.4 pg (ref 26.0–34.0)
MCHC: 32.5 g/dL (ref 30.0–36.0)
MCV: 90.6 fL (ref 80.0–100.0)
Monocytes Absolute: 0.6 10*3/uL (ref 0.1–1.0)
Monocytes Relative: 6 %
NEUTROS ABS: 8.5 10*3/uL — AB (ref 1.7–7.7)
NEUTROS PCT: 86 %
NRBC: 0 % (ref 0.0–0.2)
PLATELETS: 229 10*3/uL (ref 150–400)
RBC: 4.25 MIL/uL (ref 3.87–5.11)
RDW: 12.7 % (ref 11.5–15.5)
WBC: 9.9 10*3/uL (ref 4.0–10.5)

## 2018-02-19 LAB — URINALYSIS, ROUTINE W REFLEX MICROSCOPIC
Bilirubin Urine: NEGATIVE
Glucose, UA: NEGATIVE mg/dL
Hgb urine dipstick: NEGATIVE
KETONES UR: 15 mg/dL — AB
LEUKOCYTES UA: NEGATIVE
NITRITE: NEGATIVE
PROTEIN: NEGATIVE mg/dL
Specific Gravity, Urine: >1.03 — ABNORMAL HIGH (ref 1.005–1.030)
pH: 5.5 (ref 5.0–8.0)

## 2018-02-19 LAB — I-STAT CG4 LACTIC ACID, ED: LACTIC ACID, VENOUS: 1.22 mmol/L (ref 0.5–1.9)

## 2018-02-19 LAB — PREGNANCY, URINE: PREG TEST UR: NEGATIVE

## 2018-02-19 MED ORDER — ONDANSETRON 4 MG PO TBDP
4.0000 mg | ORAL_TABLET | Freq: Once | ORAL | Status: AC
  Administered 2018-02-19: 4 mg via ORAL
  Filled 2018-02-19: qty 1

## 2018-02-19 MED ORDER — ONDANSETRON 4 MG PO TBDP: 4.0000 mg | ORAL_TABLET | Freq: Three times a day (TID) | ORAL | 0 refills | Status: DC | PRN

## 2018-02-19 MED ORDER — PROMETHAZINE HCL 25 MG PO TABS: 25.0000 mg | ORAL_TABLET | Freq: Three times a day (TID) | ORAL | 0 refills | Status: DC | PRN

## 2018-02-19 MED ORDER — SODIUM CHLORIDE 0.9 % IV BOLUS
1000.0000 mL | Freq: Once | INTRAVENOUS | Status: AC
  Administered 2018-02-19: 1000 mL via INTRAVENOUS

## 2018-02-19 MED ORDER — ONDANSETRON HCL 4 MG/2ML IJ SOLN
4.0000 mg | Freq: Once | INTRAMUSCULAR | Status: AC
  Administered 2018-02-19: 4 mg via INTRAVENOUS
  Filled 2018-02-19: qty 2

## 2018-02-19 MED ORDER — LACTATED RINGERS IV BOLUS
1000.0000 mL | Freq: Once | INTRAVENOUS | Status: AC
  Administered 2018-02-19: 1000 mL via INTRAVENOUS

## 2018-02-19 MED ORDER — ACETAMINOPHEN 325 MG PO TABS
650.0000 mg | ORAL_TABLET | Freq: Once | ORAL | Status: AC
  Administered 2018-02-19: 650 mg via ORAL
  Filled 2018-02-19: qty 2

## 2018-02-19 NOTE — ED Notes (Signed)
2nd Lactic acid order has been d/c'd by EDP.  The initial Lactic acid was negative.

## 2018-02-19 NOTE — ED Notes (Signed)
ED Provider at bedside. 

## 2018-02-19 NOTE — ED Provider Notes (Signed)
[facility] EMERGENCY DEPARTMENT Provider Note   CSN: [id number] Arrival date & time: 02/19/18  1544     History   Chief Complaint Chief Complaint  Patient presents with  . Emesis    HPI Kara Peters is a 37 y.o. female.  The history is provided by the patient. No language interpreter was used.  Emesis     Kara Peters is a 37 y.o. female who presents to the Emergency Department complaining of and diarrhea. She presents to the emergency department complaining of nausea, vomiting, diarrhea that began early this morning. She is had fevers at home to 102 today. She has mild abdominal burning but no discrete abdominal pain. She had two episodes of vomiting and two episodes of loose stools. No hematemesis or hematochezia. Her son was also sick with similar symptoms. No known bad food exposures. 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 No additional medical problems. Past Medical History:  Diagnosis Date  . Allergy   . Anxiety   . Obesity   . Restless leg     Patient Active Problem List   Diagnosis Date Noted  . Exertional chest pain 11/29/2017  . Dyspnea on exertion 11/29/2017  . HEMATURIA UNSPECIFIED 06/04/2010  . SACROILIAC JOINT DYSFUNCTION 01/15/2010  . TROCHANTERIC BURSITIS, LEFT 01/15/2010  . SHOULDER PAIN, RIGHT 12/31/2009  . DYSURIA 05/07/2008  . EDEMA 04/04/2008  . CARPAL TUNNEL SYNDROME 03/04/2008  . TENDINITIS, WRIST 03/04/2008  . OBESITY, UNSPECIFIED 01/31/2008  . WRIST PAIN 01/31/2008  . ABNORMAL PAP SMEAR, LGSIL 07/03/2007    Past Surgical History:  Procedure Laterality Date  . LAPAROSCOPIC GASTRIC BAND REMOVAL WITH LAPAROSCOPIC GASTRIC SLEEVE RESECTION    . TUBAL LIGATION    . WISDOM TOOTH EXTRACTION    . WRIST SURGERY       OB History    Gravida  3   Para  3   Term  3   Preterm      AB      Living  3     SAB      TAB      Ectopic      Multiple      Live Births  3            Home  Medications    Prior to Admission medications   Medication Sig Start Date End Date Taking? Authorizing Provider  CALCIUM PO Take 1 tablet by mouth daily.   Yes [provider]  Cholecalciferol (VITAMIN D) 2000 units CAPS Take 2,000 Units by mouth daily.   Yes [provider]  FIBER PO Take one (1) capsule by mouth each morning and two (2) capsule by mouth each evening.   Yes [provider]  loratadine (CLARITIN) 10 MG tablet Take 10 mg by mouth daily.   Yes [provider]  rOPINIRole (REQUIP) 1 MG tablet Take 1 mg by mouth at bedtime. 10/26/17  Yes [provider]  venlafaxine XR (EFFEXOR-XR) 75 MG 24 hr capsule Take 75 mg by mouth every morning. 10/26/17  Yes [provider]  ondansetron (ZOFRAN ODT) 4 MG disintegrating tablet Take 1 tablet (4 mg total) by mouth every 8 (eight) hours as needed for nausea or vomiting. 02/19/18   Tilden Fossa, MD  promethazine (PHENERGAN) 25 MG tablet Take 1 tablet (25 mg total) by mouth every 8 (eight) hours as needed for nausea or vomiting. 02/19/18   Tilden Fossa, MD    Family History  Family History  Problem Relation Age of Onset  . Arthritis Mother   . Hypertension Mother   . Hypertension Father   . Heart disease Father   . Cancer Father        Skin  . Other Father        CABG  . Ovarian cancer Paternal Grandmother   . Heart disease Paternal Grandfather     Social History Social History   Tobacco Use  . Smoking status: Former Smoker    Types: Cigarettes    Last attempt to quit: 03/21/2005    Years since quitting: 12.9  . Smokeless tobacco: Never Used  Substance Use Topics  . Alcohol use: Never    Frequency: Never  . Drug use: No     Allergies   Nsaids   Review of Systems Review of Systems  Gastrointestinal: Positive for vomiting.  All other systems reviewed and are negative.    Physical Exam Updated Vital Signs BP 116/67 (BP Location: Left Arm)   Pulse (!) 115   Temp  99.2 F (37.3 C) (Oral)   Resp 16   Ht 5\' 2"  (1.575 m)   Wt 90.7 kg   LMP 02/11/2018   SpO2 97%   BMI 36.58 kg/m   Physical Exam  Constitutional: She is oriented to person, place, and time. She appears well-developed and well-nourished.  HENT:  Head: Normocephalic and atraumatic.  Cardiovascular: Regular rhythm.  No murmur heard. Tachycardic  Pulmonary/Chest: Effort normal and breath sounds normal. No respiratory distress.  Abdominal: Soft. There is no tenderness. There is no rebound and no guarding.  Musculoskeletal: She exhibits no edema or tenderness.  Neurological: She is alert and oriented to person, place, and time.  Skin: Skin is warm and dry.  Psychiatric: She has a normal mood and affect. Her behavior is normal.  Nursing note and vitals reviewed.    ED Treatments / Results  Labs (all labs ordered are listed, but only abnormal results are displayed) Labs Reviewed  COMPREHENSIVE METABOLIC PANEL - Abnormal; Notable for the following components:      Result Value   Sodium 132 (*)    Potassium 3.2 (*)    Glucose, Bld 109 (*)    Calcium 8.4 (*)    All other components within normal limits  CBC WITH DIFFERENTIAL/PLATELET - Abnormal; Notable for the following components:   Neutro Abs 8.5 (*)    All other components within normal limits  URINALYSIS, ROUTINE W REFLEX MICROSCOPIC - Abnormal; Notable for the following components:   Specific Gravity, Urine >1.030 (*)    Ketones, ur 15 (*)    All other components within normal limits  PREGNANCY, URINE  I-STAT CG4 LACTIC ACID, ED    EKG None  Radiology No results found.  Procedures Procedures (including critical care time)  Medications Ordered in ED Medications  acetaminophen (TYLENOL) tablet 650 mg (650 mg Oral Given 02/19/18 1614)  ondansetron (ZOFRAN-ODT) disintegrating tablet 4 mg (4 mg Oral Given 02/19/18 1613)  sodium chloride 0.9 % bolus 1,000 mL (0 mLs Intravenous Stopped 02/19/18 1752)  lactated ringers  bolus 1,000 mL (0 mLs Intravenous Stopped 02/19/18 1846)  ondansetron (ZOFRAN) injection 4 mg (4 mg Intravenous Given 02/19/18 1748)  ondansetron (ZOFRAN) injection 4 mg (4 mg Intravenous Given 02/19/18 1951)     Initial Impression / Assessment and Plan / ED Course  I have reviewed the triage vital signs and the nursing notes.  Pertinent labs & imaging results that were available during my  care of the patient were reviewed by me and considered in my medical decision making (see chart for details).    Pt here with nausea, vomiting, diarrhea. She is febrile on ED arrival. She does have sick contacts with similar symptoms at home. She is non-toxic appearing on examination. No significant abdominal tenderness. She is tolerating oral fluids in the emergency department. Current presentation is not consistent with cholecystitis, appendicitis, UTI. Discussed with patient likely gastroenteritis. Discussed home care, outpatient follow-up as well as close return precautions.  Final Clinical Impressions(s) / ED Diagnoses   Final diagnoses:  Nausea vomiting and diarrhea    ED Discharge Orders         Ordered    ondansetron (ZOFRAN ODT) 4 MG disintegrating tablet  Every 8 hours PRN     02/19/18 2000    promethazine (PHENERGAN) 25 MG tablet  Every 8 hours PRN     02/19/18 Terese Door, MD 02/19/18 2004

## 2018-02-19 NOTE — ED Triage Notes (Signed)
N/V/D since 10pm last night.  Fever 102.3 at home PTA.  Did not take anything due to nausea.

## 2021-01-16 ENCOUNTER — Other Ambulatory Visit: Payer: Self-pay

## 2021-01-16 ENCOUNTER — Emergency Department (HOSPITAL_BASED_OUTPATIENT_CLINIC_OR_DEPARTMENT_OTHER): Payer: Managed Care, Other (non HMO)

## 2021-01-16 ENCOUNTER — Emergency Department (HOSPITAL_BASED_OUTPATIENT_CLINIC_OR_DEPARTMENT_OTHER)
Admission: EM | Admit: 2021-01-16 | Discharge: 2021-01-16 | Disposition: A | Discharge status: Home / Self Care | Payer: Managed Care, Other (non HMO) | Attending: Emergency Medicine | Admitting: Emergency Medicine

## 2021-01-16 ENCOUNTER — Encounter (HOSPITAL_BASED_OUTPATIENT_CLINIC_OR_DEPARTMENT_OTHER): Payer: Self-pay

## 2021-01-16 DIAGNOSIS — Z87891 Personal history of nicotine dependence: Secondary | ICD-10-CM | POA: Insufficient documentation

## 2021-01-16 DIAGNOSIS — R1013 Epigastric pain: Secondary | ICD-10-CM | POA: Diagnosis not present

## 2021-01-16 DIAGNOSIS — R0602 Shortness of breath: Secondary | ICD-10-CM | POA: Diagnosis not present

## 2021-01-16 DIAGNOSIS — R197 Diarrhea, unspecified: Secondary | ICD-10-CM | POA: Diagnosis not present

## 2021-01-16 DIAGNOSIS — R072 Precordial pain: Secondary | ICD-10-CM | POA: Insufficient documentation

## 2021-01-16 LAB — CBC WITH DIFFERENTIAL/PLATELET
Abs Immature Granulocytes: 0.04 10*3/uL (ref 0.00–0.07)
Basophils Absolute: 0 10*3/uL (ref 0.0–0.1)
Basophils Relative: 0 %
Eosinophils Absolute: 0.4 10*3/uL (ref 0.0–0.5)
Eosinophils Relative: 5 %
HCT: 37.6 % (ref 36.0–46.0)
Hemoglobin: 12.8 g/dL (ref 12.0–15.0)
Immature Granulocytes: 1 %
Lymphocytes Relative: 24 %
Lymphs Abs: 2 10*3/uL (ref 0.7–4.0)
MCH: 30.8 pg (ref 26.0–34.0)
MCHC: 34 g/dL (ref 30.0–36.0)
MCV: 90.6 fL (ref 80.0–100.0)
Monocytes Absolute: 0.5 10*3/uL (ref 0.1–1.0)
Monocytes Relative: 7 %
Neutro Abs: 5.3 10*3/uL (ref 1.7–7.7)
Neutrophils Relative %: 63 %
Platelets: 261 10*3/uL (ref 150–400)
RBC: 4.15 MIL/uL (ref 3.87–5.11)
RDW: 12.6 % (ref 11.5–15.5)
WBC: 8.3 10*3/uL (ref 4.0–10.5)
nRBC: 0 % (ref 0.0–0.2)

## 2021-01-16 LAB — COMPREHENSIVE METABOLIC PANEL
ALT: 24 U/L (ref 0–44)
AST: 23 U/L (ref 15–41)
Albumin: 3.9 g/dL (ref 3.5–5.0)
Alkaline Phosphatase: 67 U/L (ref 38–126)
Anion gap: 7 (ref 5–15)
BUN: 9 mg/dL (ref 6–20)
CO2: 26 mmol/L (ref 22–32)
Calcium: 9.3 mg/dL (ref 8.9–10.3)
Chloride: 102 mmol/L (ref 98–111)
Creatinine, Ser: 0.76 mg/dL (ref 0.44–1.00)
GFR, Estimated: >60 mL/min (ref >60)
Glucose, Bld: 97 mg/dL (ref 70–99)
Potassium: 4.3 mmol/L (ref 3.5–5.1)
Sodium: 135 mmol/L (ref 135–145)
Total Bilirubin: 0.2 mg/dL — ABNORMAL LOW (ref 0.3–1.2)
Total Protein: 6.9 g/dL (ref 6.5–8.1)

## 2021-01-16 LAB — URINALYSIS, ROUTINE W REFLEX MICROSCOPIC
Bilirubin Urine: NEGATIVE
Glucose, UA: NEGATIVE mg/dL
Hgb urine dipstick: NEGATIVE
Ketones, ur: NEGATIVE mg/dL
Leukocytes,Ua: NEGATIVE
Nitrite: NEGATIVE
Protein, ur: NEGATIVE mg/dL
Specific Gravity, Urine: 1.02 (ref 1.005–1.030)
pH: 8.5 — ABNORMAL HIGH (ref 5.0–8.0)

## 2021-01-16 LAB — LIPASE, BLOOD: Lipase: 27 U/L (ref 11–51)

## 2021-01-16 LAB — TROPONIN I (HIGH SENSITIVITY)
Troponin I (High Sensitivity): <2 ng/L (ref <18)
Troponin I (High Sensitivity): <2 ng/L (ref <18)

## 2021-01-16 LAB — PREGNANCY, URINE: Preg Test, Ur: NEGATIVE

## 2021-01-16 MED ORDER — IOHEXOL 300 MG/ML  SOLN
100.0000 mL | Freq: Once | INTRAMUSCULAR | Status: AC | PRN
  Administered 2021-01-16: 100 mL via INTRAVENOUS

## 2021-01-16 MED ORDER — MYLANTA MAXIMUM STRENGTH 400-400-40 MG/5ML PO SUSP: 15.0000 mL | Freq: Four times a day (QID) | ORAL | 0 refills | Status: DC | PRN

## 2021-01-16 MED ORDER — PANTOPRAZOLE SODIUM 40 MG PO TBEC: 40.0000 mg | DELAYED_RELEASE_TABLET | Freq: Every day | ORAL | 0 refills | Status: DC

## 2021-01-16 MED ORDER — PANTOPRAZOLE SODIUM 40 MG IV SOLR
40.0000 mg | Freq: Once | INTRAVENOUS | Status: AC
  Administered 2021-01-16: 40 mg via INTRAVENOUS
  Filled 2021-01-16: qty 40

## 2021-01-16 MED ORDER — SUCRALFATE 1 G PO TABS: 1.0000 g | ORAL_TABLET | Freq: Three times a day (TID) | ORAL | 0 refills | Status: DC

## 2021-01-16 MED ORDER — ALUM & MAG HYDROXIDE-SIMETH 200-200-20 MG/5ML PO SUSP
30.0000 mL | Freq: Once | ORAL | Status: AC
  Administered 2021-01-16: 30 mL via ORAL
  Filled 2021-01-16: qty 30

## 2021-01-16 NOTE — Discharge Instructions (Addendum)
You came to the emergency department today due to your stomach pain and chest pain.  Your CT scan showed no signs of any acute abnormalities within your abdomen or pelvis.  Your chest x-ray, EKG, and troponins were reassuring you are not having acute heart attack today.  Your symptoms may be due to acid reflux also known as GERD.  Due to this I have given you prescription for Mylanta, Protonix, and Carafate.  Please take these medications as prescribed.  Please follow-up with your primary care provider and [facility] gastroenterology.  Get help right away if: Your pain does not go away as soon as your health care provider told you to expect. You cannot stop vomiting. Your pain is only in areas of the abdomen, such as the right side or the left lower portion of the abdomen. Pain on the right side could be caused by appendicitis. You have bloody or black stools, or stools that look like tar. You have severe pain, cramping, or bloating in your abdomen. You have signs of dehydration, such as: Dark urine, very little urine, or no urine. Cracked lips. Dry mouth. Sunken eyes. Sleepiness. Weakness. You have trouble breathing or chest pain.

## 2021-01-16 NOTE — ED Provider Notes (Signed)
[facility] EMERGENCY DEPARTMENT Provider Note   CSN: [id number] Arrival date & time: 01/16/21  6440     History Chief Complaint  Patient presents with   Abdominal Pain    Kara Peters is a 40 y.o. female with a history of anxiety, laparoscopic gastric band surgery, tubal ligation.  Presents to the emergency department with a chief complaint of abdominal pain and chest pain.  Patient reports that she has had these pains intermittently over the last 2 days.  States that abdominal pain is epigastric.  States that pain feels like a "squeezing and pressure."  Pain will intermittently radiate through chest to jaw.  Patient rates pain 5/10 on the pain scale.  Last episode of pain started this morning upon waking at 0 800.  Patient endorses some associated shortness of breath stating she is like "cannot get a good breath," due to the pain.  Patient denies any associated nausea, vomiting, or diaphoresis.  States that pain comes on randomly however also is exacerbated by eating.  Patient endorses diarrhea.  Patient denies any fever, chills, URI symptoms, constipation, blood in stool, melena, dysuria, hematuria, urinary urgency, vaginal pain, vaginal bleeding, vaginal discharge, genital sores or lesions.   Abdominal Pain Associated symptoms: chest pain, diarrhea and shortness of breath   Associated symptoms: no chills, no constipation, no cough, no dysuria, no fever, no nausea, no sore throat, no vaginal bleeding, no vaginal discharge and no vomiting       Past Medical History:  Diagnosis Date   Allergy    Anxiety    Obesity    Restless leg     Patient Active Problem List   Diagnosis Date Noted   Exertional chest pain 11/29/2017   Dyspnea on exertion 11/29/2017   HEMATURIA UNSPECIFIED 06/04/2010   SACROILIAC JOINT DYSFUNCTION 01/15/2010   TROCHANTERIC BURSITIS, LEFT 01/15/2010   SHOULDER PAIN, RIGHT 12/31/2009   DYSURIA 05/07/2008   EDEMA 04/04/2008   CARPAL TUNNEL  SYNDROME 03/04/2008   TENDINITIS, WRIST 03/04/2008   OBESITY, UNSPECIFIED 01/31/2008   WRIST PAIN 01/31/2008   ABNORMAL PAP SMEAR, LGSIL 07/03/2007    Past Surgical History:  Procedure Laterality Date   LAPAROSCOPIC GASTRIC BAND REMOVAL WITH LAPAROSCOPIC GASTRIC SLEEVE RESECTION     TUBAL LIGATION     WISDOM TOOTH EXTRACTION     WRIST SURGERY       OB History     Gravida  3   Para  3   Term  3   Preterm      AB      Living  3      SAB      IAB      Ectopic      Multiple      Live Births  3           Family History  Problem Relation Age of Onset   Arthritis Mother    Hypertension Mother    Hypertension Father    Heart disease Father    Cancer Father        Skin   Other Father        CABG   Ovarian cancer Paternal Grandmother    Heart disease Paternal Grandfather     Social History   Tobacco Use   Smoking status: Former    Types: Cigarettes    Quit date: 03/21/2005    Years since quitting: 15.8   Smokeless tobacco: Never  Substance Use Topics   Alcohol use: Never  Drug use: No    Home Medications Prior to Admission medications   Medication Sig Start Date End Date Taking? Authorizing Provider  buPROPion (WELLBUTRIN XL) 150 MG 24 hr tablet Take 150 mg by mouth daily.   Yes [provider]  CALCIUM PO Take 1 tablet by mouth daily.   Yes [provider]  cetirizine (ZYRTEC) 5 MG tablet Take 5 mg by mouth daily.   Yes [provider]  Cholecalciferol (VITAMIN D) 2000 units CAPS Take 2,000 Units by mouth daily.   Yes [provider]  rOPINIRole (REQUIP) 1 MG tablet Take 1 mg by mouth at bedtime. 10/26/17  Yes [provider]  thyroid (ARMOUR) 30 MG tablet Take 30 mg by mouth daily before breakfast.   Yes [provider]  traZODone (DESYREL) 50 MG tablet Take 50 mg by mouth at bedtime.   Yes [provider]  FIBER PO Take one (1) capsule by mouth each morning and two (2) capsule by  mouth each evening.    [provider]  loratadine (CLARITIN) 10 MG tablet Take 10 mg by mouth daily.    [provider]  ondansetron (ZOFRAN ODT) 4 MG disintegrating tablet Take 1 tablet (4 mg total) by mouth every 8 (eight) hours as needed for nausea or vomiting. 02/19/18   Tilden Fossa, MD  promethazine (PHENERGAN) 25 MG tablet Take 1 tablet (25 mg total) by mouth every 8 (eight) hours as needed for nausea or vomiting. 02/19/18   Tilden Fossa, MD  venlafaxine XR (EFFEXOR-XR) 75 MG 24 hr capsule Take 75 mg by mouth every morning. 10/26/17   [provider]    Allergies    Nsaids  Review of Systems   Review of Systems  Constitutional:  Negative for chills and fever.  HENT:  Negative for congestion, rhinorrhea and sore throat.   Eyes:  Negative for visual disturbance.  Respiratory:  Positive for shortness of breath. Negative for cough.   Cardiovascular:  Positive for chest pain. Negative for palpitations and leg swelling.  Gastrointestinal:  Positive for abdominal pain and diarrhea. Negative for abdominal distention, anal bleeding, blood in stool, constipation, nausea, rectal pain and vomiting.  Genitourinary:  Negative for difficulty urinating, dysuria, flank pain, frequency, genital sores, pelvic pain, urgency, vaginal bleeding, vaginal discharge and vaginal pain.  Musculoskeletal:  Negative for back pain and neck pain.  Skin:  Negative for color change and rash.  Neurological:  Negative for dizziness, syncope, light-headedness and headaches.  Psychiatric/Behavioral:  Negative for confusion.    Physical Exam Updated Vital Signs BP (!) 155/84   Pulse 81   Temp 98.3 F (36.8 C)   Resp 16   Ht 5\' 2"  (1.575 m)   Wt 99.8 kg   SpO2 99%   BMI 40.24 kg/m   Physical Exam Vitals and nursing note reviewed.  Constitutional:      General: She is not in acute distress.    Appearance: She is not ill-appearing, toxic-appearing or diaphoretic.  HENT:     Head:  Normocephalic.  Eyes:     General: No scleral icterus.       Right eye: No discharge.        Left eye: No discharge.  Cardiovascular:     Rate and Rhythm: Normal rate.  Pulmonary:     Effort: Pulmonary effort is normal. No tachypnea, bradypnea or respiratory distress.     Breath sounds: Normal breath sounds. No stridor.  Abdominal:     General: A  surgical scar is present. Bowel sounds are normal. There is no distension. There are no signs of injury.     Palpations: Abdomen is soft. There is no mass or pulsatile mass.     Tenderness: There is abdominal tenderness in the right lower quadrant, epigastric area and left upper quadrant. There is no right CVA tenderness, left CVA tenderness, guarding or rebound.     Hernia: There is no hernia in the umbilical area or ventral area.     Comments: Well-healed laparoscopic scars  Musculoskeletal:     Right lower leg: Normal.     Left lower leg: Normal.  Skin:    General: Skin is warm and dry.  Neurological:     General: No focal deficit present.     Mental Status: She is alert.  Psychiatric:        Behavior: Behavior is cooperative.    ED Results / Procedures / Treatments   Labs (all labs ordered are listed, but only abnormal results are displayed) Labs Reviewed  COMPREHENSIVE METABOLIC PANEL - Abnormal; Notable for the following components:      Result Value   Total Bilirubin 0.2 (*)    All other components within normal limits  URINALYSIS, ROUTINE W REFLEX MICROSCOPIC - Abnormal; Notable for the following components:   APPearance CLOUDY (*)    pH 8.5 (*)    All other components within normal limits  CBC WITH DIFFERENTIAL/PLATELET  LIPASE, BLOOD  PREGNANCY, URINE  TROPONIN I (HIGH SENSITIVITY)  TROPONIN I (HIGH SENSITIVITY)    EKG EKG Interpretation  Date/Time:  Saturday January 16 2021 10:22:47 EDT Ventricular Rate:  78 PR Interval:  131 QRS Duration: 92 QT Interval:  394 QTC Calculation: 449 R Axis:   71 Text  Interpretation: Sinus rhythm Confirmed by Gloris Manchester ([id number]) on 01/16/2021 10:40:29 AM  Radiology CT ABDOMEN PELVIS W CONTRAST  Result Date: 01/16/2021 CLINICAL DATA:  Epigastric pain LUQ abdominal pain EXAM: CT ABDOMEN AND PELVIS WITH CONTRAST TECHNIQUE: Multidetector CT imaging of the abdomen and pelvis was performed using the standard protocol following bolus administration of intravenous contrast. CONTRAST:  OMNIPAQUE IOHEXOL 300 MG/ML  SOLN COMPARISON:  None. FINDINGS: Inferior chest: The lung bases are well-aerated. Hepatobiliary: The liver is normal in size without focal abnormality. No intrahepatic or extrahepatic biliary ductal dilation. The gallbladder appears normal. Spleen: Normal in size without focal abnormality. Pancreas: No pancreatic ductal dilatation or surrounding inflammatory changes. Adrenals/Urinary Tract: Adrenal glands are unremarkable. Kidneys are normal, without renal calculi, focal lesion, or hydronephrosis. Bladder is unremarkable. Stomach/Bowel: Status post sleeve gastrectomy. The stomach, small bowel and large bowel are normal in caliber without abnormal wall thickening or surrounding inflammatory changes. The appendix is normal. Reproductive: Uterus and bilateral adnexa are unremarkable. Lymphatic: No enlarged lymph nodes in the abdomen or pelvis. Vasculature: The abdominal aorta is normal in caliber. The portal venous system is patent. Other: No abdominopelvic ascites. Musculoskeletal: No aggressive osseous lesions. Mild degenerative changes at L5-S1. The soft tissues are unremarkable. IMPRESSION: No acute findings in the abdomen or pelvis. Postsurgical changes of sleeve gastrectomy without complicating feature. Electronically Signed   By: Olive Bass M.D.   On: 01/16/2021 12:03   DG Chest Portable 1 View  Result Date: 01/16/2021 CLINICAL DATA:  Chest pain, palpitations EXAM: PORTABLE CHEST 1 VIEW COMPARISON:  None. FINDINGS: The lungs are clear and negative for  focal airspace consolidation, pulmonary edema or suspicious pulmonary nodule. No pleural effusion or pneumothorax. Cardiac and mediastinal contours are within  normal limits. No acute fracture or lytic or blastic osseous lesions. The visualized upper abdominal bowel gas pattern is unremarkable. IMPRESSION: Negative chest x-ray. Electronically Signed   By: Malachy Moan M.D.   On: 01/16/2021 11:17    Procedures Procedures   Medications Ordered in ED Medications  alum & mag hydroxide-simeth (MAALOX/MYLANTA) 200-200-20 MG/5ML suspension 30 mL (30 mLs Oral Given 01/16/21 1042)  pantoprazole (PROTONIX) injection 40 mg (40 mg Intravenous Given 01/16/21 1043)  iohexol (OMNIPAQUE) 300 MG/ML solution 100 mL (100 mLs Intravenous Contrast Given 01/16/21 1123)    ED Course  I have reviewed the triage vital signs and the nursing notes.  Pertinent labs & imaging results that were available during my care of the patient were reviewed by me and considered in my medical decision making (see chart for details).    MDM Rules/Calculators/A&P                           Alert 40 year old female no acute distress, nontoxic-appearing.  Presents to ED with chief complaint of abdominal pain and chest pain.  Will obtain ACS work-up. EKG shows sinus rhythm Chest x-ray shows no active cardiopulmonary disease Troponin less than 2 and less than 2 Heart score 2; low suspicion for ACS at this time.  We will have patient follow-up closely with her primary care provider.  Will obtain CMP, CBC, lipase, urinalysis, and pregnancy test to evaluate for patient's abdominal pain.  Patient given Pepcid and GI cocktail.  To do tenderness to right lower quadrant, epigastric area, and left upper quadrant on exam will obtain noncontrast CT scan to evaluate for appendicitis, acute pancreatitis, and hepatobiliary disease.  CBC, CMP, UA, lipase, pregnancy test unremarkable. CT scan shows no acute abnormality in abdomen or  pelvis.  On serial reexamination abdomen soft and nondistended.  Patient hemodynamically stable.  Reports improvement in symptoms after receiving GI cocktail and Protonix.  Plan to discharge patient with prescription for Mylanta, Protonix, and Carafate.  Patient to follow-up with gastroenterology and her primary care provider.  Discussed results, findings, treatment and follow up. Patient advised of return precautions. Patient verbalized understanding and agreed with plan.    Final Clinical Impression(s) / ED Diagnoses Final diagnoses:  Epigastric pain  Precordial chest pain    Rx / DC Orders ED Discharge Orders          Ordered    alum & mag hydroxide-simeth [facility] MAXIMUM STRENGTH) 400-400-40 MG/5ML suspension  Every 6 hours PRN        01/16/21 1313    sucralfate (CARAFATE) 1 g tablet  3 times daily with meals & bedtime        01/16/21 1313    pantoprazole (PROTONIX) 40 MG tablet  Daily        01/16/21 1313             Berneice Heinrich 01/16/21 2132    Gloris Manchester, MD 01/17/21 [id number]

## 2021-01-16 NOTE — ED Triage Notes (Signed)
Pt reports "burning sensation" in epigastric area x2 days with palpitations. Pt also reports nausea and diarrhea during this time. Hx of IBS-C. NAD during triage.

## 2021-02-09 ENCOUNTER — Ambulatory Visit: Payer: Managed Care, Other (non HMO) | Admitting: Nurse Practitioner

## 2021-03-16 ENCOUNTER — Ambulatory Visit: Payer: Managed Care, Other (non HMO) | Admitting: Family

## 2021-03-18 ENCOUNTER — Ambulatory Visit (INDEPENDENT_AMBULATORY_CARE_PROVIDER_SITE_OTHER): Payer: Managed Care, Other (non HMO) | Admitting: Family Medicine

## 2021-03-18 ENCOUNTER — Encounter: Payer: Self-pay | Admitting: Family Medicine

## 2021-03-18 ENCOUNTER — Other Ambulatory Visit: Payer: Self-pay

## 2021-03-18 VITALS — BP 126/62 | HR 80 | Ht 62.0 in | Wt 221.0 lb

## 2021-03-18 DIAGNOSIS — E039 Hypothyroidism, unspecified: Secondary | ICD-10-CM | POA: Diagnosis not present

## 2021-03-18 DIAGNOSIS — R87610 Atypical squamous cells of undetermined significance on cytologic smear of cervix (ASC-US): Secondary | ICD-10-CM

## 2021-03-18 DIAGNOSIS — R5383 Other fatigue: Secondary | ICD-10-CM | POA: Diagnosis not present

## 2021-03-18 DIAGNOSIS — L509 Urticaria, unspecified: Secondary | ICD-10-CM

## 2021-03-18 DIAGNOSIS — G2581 Restless legs syndrome: Secondary | ICD-10-CM

## 2021-03-18 DIAGNOSIS — F418 Other specified anxiety disorders: Secondary | ICD-10-CM | POA: Diagnosis not present

## 2021-03-18 DIAGNOSIS — Z87891 Personal history of nicotine dependence: Secondary | ICD-10-CM

## 2021-03-18 MED ORDER — FLUOXETINE HCL 20 MG PO TABS: 20.0000 mg | ORAL_TABLET | Freq: Every day | ORAL | 0 refills | Status: DC

## 2021-03-18 NOTE — Patient Instructions (Signed)
Increase OTC zyrtec to twice a day every day for hives.  Start fluoxetine and call if you have any problems. Taper or stop the ativan. I will send you a note about your labs.  Let me see you in two weeks.

## 2021-03-19 ENCOUNTER — Encounter: Payer: Self-pay | Admitting: Family Medicine

## 2021-03-19 DIAGNOSIS — G2581 Restless legs syndrome: Secondary | ICD-10-CM | POA: Insufficient documentation

## 2021-03-19 DIAGNOSIS — E039 Hypothyroidism, unspecified: Secondary | ICD-10-CM | POA: Insufficient documentation

## 2021-03-19 DIAGNOSIS — Z87891 Personal history of nicotine dependence: Secondary | ICD-10-CM | POA: Insufficient documentation

## 2021-03-19 DIAGNOSIS — F418 Other specified anxiety disorders: Secondary | ICD-10-CM | POA: Insufficient documentation

## 2021-03-19 DIAGNOSIS — L509 Urticaria, unspecified: Secondary | ICD-10-CM | POA: Insufficient documentation

## 2021-03-19 LAB — COMPREHENSIVE METABOLIC PANEL
ALT: 20 IU/L (ref 0–32)
AST: 16 IU/L (ref 0–40)
Albumin/Globulin Ratio: 1.9 (ref 1.2–2.2)
Albumin: 4.5 g/dL (ref 3.8–4.8)
Alkaline Phosphatase: 77 IU/L (ref 44–121)
BUN/Creatinine Ratio: 18 (ref 9–23)
BUN: 16 mg/dL (ref 6–24)
Bilirubin Total: 0.3 mg/dL (ref 0.0–1.2)
CO2: 21 mmol/L (ref 20–29)
Calcium: 9.5 mg/dL (ref 8.7–10.2)
Chloride: 104 mmol/L (ref 96–106)
Creatinine, Ser: 0.87 mg/dL (ref 0.57–1.00)
Globulin, Total: 2.4 g/dL (ref 1.5–4.5)
Glucose: 90 mg/dL (ref 70–99)
Potassium: 4.5 mmol/L (ref 3.5–5.2)
Sodium: 139 mmol/L (ref 134–144)
Total Protein: 6.9 g/dL (ref 6.0–8.5)
eGFR: 86 mL/min/{1.73_m2} (ref >59)

## 2021-03-19 LAB — LIPID PANEL
Chol/HDL Ratio: 4 ratio (ref 0.0–4.4)
Cholesterol, Total: 214 mg/dL — ABNORMAL HIGH (ref 100–199)
HDL: 53 mg/dL (ref >39)
LDL Chol Calc (NIH): 140 mg/dL — ABNORMAL HIGH (ref 0–99)
Triglycerides: 117 mg/dL (ref 0–149)
VLDL Cholesterol Cal: 21 mg/dL (ref 5–40)

## 2021-03-19 LAB — THYROID PANEL WITH TSH
Free Thyroxine Index: 1.5 (ref 1.2–4.9)
T3 Uptake Ratio: 25 % (ref 24–39)
T4, Total: 5.9 ug/dL (ref 4.5–12.0)
TSH: 2.24 u[IU]/mL (ref 0.450–4.500)

## 2021-03-19 NOTE — Assessment & Plan Note (Signed)
The 1 abnormal TSH she has in her past record was in October, 2021 and her TSH was 5.1.  They started her on thyroid supplement and the recheck showed her TSH in normal range.  I would like to get a thyroid panel today.  A combination T3 and T4 type of medication.  We can discuss further when I see her back.

## 2021-03-19 NOTE — Assessment & Plan Note (Signed)
She is only been taking one of the lorazepam tabs a day and I urged her to taper off that as she could.  She can just stop it given the low-dose.  We will start fluoxetine and see her back in 2 weeks.  For now we will continue the trazodone and Requip at night.

## 2021-03-19 NOTE — Assessment & Plan Note (Signed)
Does seem to be related to heat and anxiety.  For now we will increase her Zyrtec to twice daily.  Follow-up 2 to 3 weeks.

## 2021-03-19 NOTE — Assessment & Plan Note (Signed)
Continue requip

## 2021-03-19 NOTE — Progress Notes (Signed)
CHIEF COMPLAINT / HPI:   The patient to establish care.   Primary issue at this time is anxiety.  Lifelong problems with this for started medication in her late teen years.  (2015). Has never really had much success with treatment. Most recently she was placed on fracture or without much help at all.  She thinks it actually may have increased her irritability.  Her PCP then did some type of gene testing on her and after results of that she was placed on 0.5 mg of Ativan to be used as needed up to 3 times daily.  Ativan does not seem to help and in fact she thinks it makes her feel funny or may be even more irritable.  She has been taking it once at night.   -Has had some problems initiating sleep and was started on trazodone which seems to help.  - - She takes her Requip at night for her restless legs and says that has helped although she still has symptoms.    Anxiety depression: Her symptoms include irritability and racing mind with inability to focus on task at times.  No episodes of overspending or unusual behavior.  No episodes of excessive sleepiness or long periods of activity without sleeping no hallucinations or suicidal or homicidal ideation.  Sleep is generally okay when she gets to sleep.  Appetite is baseline.  No unusual weight gain or loss.   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  She does have a standing desk and tries to get a little bit of walking in each day. No illicits, no smoking (former smoker quit in 2007)  #2.  Recently has noticed that she has episodes of breaking out in hives.  She brings me some pictures of that.  They usually resolve within 12 to 24 hours.  Does seem to be related to anxiety and temperature.  PERTINENT  PMH / PSH: I have reviewed the patients medications, allergies, past medical and surgical history, smoking status and updated in the EMR as appropriate. -Updated surgical and medical history and procedures:Reviewed prior TSH  results -Reviewed prior (last 2 ) pap tests -Reviewed surgical hx and surgical pathology ([facility]) -Looked for resuts colonoscopy / EGD -COVID in 2021 where she lost her sense of smell and taste and neither has returned to baseline.  She was also started on thyroid medication at that time.  Recent EGD and colonoscopy which were normal per report.  OBJECTIVE:  BP 126/62    Pulse 80    Ht 5\' 2"  (1.575 m)    Wt 221 lb (100.2 kg)    LMP 02/27/2021    SpO2 98%    BMI 40.42 kg/m  PSYCH: AxOx4. Good eye contact.. No psychomotor retardation or agitation. Appropriate speech fluency and content. Asks and answers questions appropriately. Mood is congruent. SKIN: The photo she shows me of her outbreaks of rash are typical hives.  She also shows me a picture that is consistent with dermatographia from where she was scratching the anterior chest.  All of these have resolved.  On date of exam she has no rash.  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  She can just stop it given the low-dose.  We will start fluoxetine and see her back in 2 weeks.  For now we will continue the trazodone and Requip  at night.  Hives and dermatographia Does seem to be related to heat and anxiety.  For now we will increase her Zyrtec to twice daily.  Follow-up 2 to 3 weeks.  Hypothyroidism The 1 abnormal TSH she has in her past record was in October, 2021 and her TSH was 5.1.  They started her on thyroid supplement and the recheck showed her TSH in normal range.  I would like to get a thyroid panel today.  A combination T3 and T4 type of medication.  We can discuss further when I see her back.  Restless legs Continue requip  Abnormal Pap smear of cervix Pap due in 2-3 m   Denny Levy MD

## 2021-03-19 NOTE — Assessment & Plan Note (Signed)
Pap due in 2-3 m

## 2021-03-23 ENCOUNTER — Encounter: Payer: Self-pay | Admitting: Family Medicine

## 2021-03-31 ENCOUNTER — Other Ambulatory Visit: Payer: Self-pay

## 2021-03-31 ENCOUNTER — Ambulatory Visit (INDEPENDENT_AMBULATORY_CARE_PROVIDER_SITE_OTHER): Payer: Managed Care, Other (non HMO) | Admitting: Family Medicine

## 2021-03-31 ENCOUNTER — Encounter: Payer: Self-pay | Admitting: Family Medicine

## 2021-03-31 DIAGNOSIS — E039 Hypothyroidism, unspecified: Secondary | ICD-10-CM | POA: Diagnosis not present

## 2021-03-31 DIAGNOSIS — F418 Other specified anxiety disorders: Secondary | ICD-10-CM | POA: Diagnosis not present

## 2021-04-01 NOTE — Progress Notes (Signed)
° ° °  CHIEF COMPLAINT / HPI: #1.  Follow-up discontinuation of thyroid supplement.  Per our discussion in letter after her lab results, she has discontinued the thyroid supplement.  Currently no new symptoms. 2.  Follow-up starting medication for anxious depression: She had only taken a couple of doses of the benzodiazepine provided by previous provider so there is no taper down she just discontinue that medicine and started the fluoxetine.  She feels better already from the standpoint of irritability.  She is having some problems with sleep.  Problems both initiating but more severe problems with maintaining sleep.  She also notes that she started back in school and has 3 classes and that may be contributing to her stressors so she is not sure if it is related to starting the new medication or school.  Taking advance to counseling, mass and astronomy.  Astronomy is an elective and she is enjoying that.   PERTINENT  PMH / PSH: I have reviewed the patients medications, allergies, past medical and surgical history, smoking status and updated in the EMR as appropriate.   OBJECTIVE:  BP 125/87    Pulse 98    Ht 5\' 2"  (1.575 m)    Wt 226 lb 3.2 oz (102.6 kg)    LMP 03/25/2021 (Exact Date)    SpO2 (!) 77%    BMI 41.37 kg/m    ASSESSMENT / PLAN:   Anxious depression Continue current dose of fluoxetine and follow-up 4 to 6 weeks.  She is already seeing some improvement.  We will consider dose adjustment if needed at next follow-up.  Regarding the sleep issues, I am not sure if this is related to starting this new medicine or situational stressors and neither is she.  We decided to continue her current dose of fluoxetine and her longstanding dose of trazodone unchanged but have her move the timing of the fluoxetine to nighttime.  If that does not improve issues, she will let me know and I would consider increasing trazodone.  She has follow-up scheduled 4 to 6 weeks.  Should she develop any other worsening  symptoms, she will let me know.  Hypothyroidism I agree with stopping the thyroid supplement.  We will plan to check TSH in about 6 weeks.  Should she develop symptoms in the interim, she will let me know.   05/23/2021 MD

## 2021-04-01 NOTE — Assessment & Plan Note (Signed)
Continue current dose of fluoxetine and follow-up 4 to 6 weeks.  She is already seeing some improvement.  We will consider dose adjustment if needed at next follow-up.  Regarding the sleep issues, I am not sure if this is related to starting this new medicine or situational stressors and neither is she.  We decided to continue her current dose of fluoxetine and her longstanding dose of trazodone unchanged but have her move the timing of the fluoxetine to nighttime.  If that does not improve issues, she will let me know and I would consider increasing trazodone.  She has follow-up scheduled 4 to 6 weeks.  Should she develop any other worsening symptoms, she will let me know.

## 2021-04-01 NOTE — Assessment & Plan Note (Signed)
I agree with stopping the thyroid supplement.  We will plan to check TSH in about 6 weeks.  Should she develop symptoms in the interim, she will let me know.

## 2021-04-12 ENCOUNTER — Other Ambulatory Visit: Payer: Self-pay | Admitting: Family Medicine

## 2021-05-10 ENCOUNTER — Other Ambulatory Visit: Payer: Self-pay | Admitting: Family Medicine

## 2021-05-10 DIAGNOSIS — E039 Hypothyroidism, unspecified: Secondary | ICD-10-CM

## 2021-05-10 NOTE — Progress Notes (Signed)
Orders placed for thyroid test

## 2021-05-12 ENCOUNTER — Ambulatory Visit (INDEPENDENT_AMBULATORY_CARE_PROVIDER_SITE_OTHER): Payer: Managed Care, Other (non HMO) | Admitting: Family Medicine

## 2021-05-12 ENCOUNTER — Other Ambulatory Visit: Payer: Self-pay

## 2021-05-12 ENCOUNTER — Encounter: Payer: Self-pay | Admitting: Family Medicine

## 2021-05-12 ENCOUNTER — Other Ambulatory Visit: Payer: 59

## 2021-05-12 DIAGNOSIS — E039 Hypothyroidism, unspecified: Secondary | ICD-10-CM

## 2021-05-12 DIAGNOSIS — F418 Other specified anxiety disorders: Secondary | ICD-10-CM | POA: Diagnosis not present

## 2021-05-12 MED ORDER — FLUOXETINE HCL 10 MG PO CAPS: ORAL_CAPSULE | ORAL | 1 refills | Status: DC

## 2021-05-12 MED ORDER — ROPINIROLE HCL 1 MG PO TABS: 1.0000 mg | ORAL_TABLET | Freq: Every day | ORAL | 3 refills | Status: DC

## 2021-05-12 MED ORDER — TRAZODONE HCL 50 MG PO TABS: 50.0000 mg | ORAL_TABLET | Freq: Every day | ORAL | 3 refills | Status: DC

## 2021-05-12 NOTE — Patient Instructions (Signed)
Let me know in 3-4 weeks how you are doing with increased dose. I will send you a note about your blood work. Great to see yoU!

## 2021-05-12 NOTE — Progress Notes (Signed)
° ° °  CHIEF COMPLAINT / HPI: #1.  Follow-up anxious depression: Still having some anxiety particularly in the mornings when she wakes up.  Does think this is partly related to arthritis stressors from current semester in school. 2.  Sleep: Having better luck getting to sleep but has had some early morning awakenings.  Not sure if this is related to increased stressors or to underlying anxiety and depression.  No suicidal homicidal ideation.  She is managing to work full-time and go to school and care for her family without problems. 3.  Follow-up thyroid: We have stopped her Synthroid replacement she is here.  She is felt a little bit more tired but thinks that may be related to the other issues rather than thyroid but she is anxious to see the blood work.  PERTINENT  PMH / PSH: I have reviewed the patients medications, allergies, past medical and surgical history, smoking status and updated in the EMR as appropriate.   OBJECTIVE:  BP 123/73    Pulse 77    Wt 225 lb (102.1 kg)    LMP 04/22/2021 (Approximate)    BMI 41.15 kg/m  Vital signs reviewed. GENERAL: Well-developed, well-nourished, no acute distress. CARDIOVASCULAR: Regular rate and rhythm no murmur gallop or rub LUNGS: Clear to auscultation bilaterally, no rales or wheeze. ABDOMEN: Soft positive bowel sounds NEURO: No gross focal neurological deficits. MSK: Movement of extremity x 4. PSYCH: AxOx4. Good eye contact.. No psychomotor retardation or agitation. Appropriate speech fluency and content. Asks and answers questions appropriately. Mood is congruent.   ASSESSMENT / PLAN:   Anxious depression Increase fluoxetine to 30 mg a day and f/u 4-6 weeks, sooner if problems. If sleep issues continue, could consider increase amitriptyline   Hypothyroidism rechck labs today as she has been off med 6 w   Denny Levy MD

## 2021-05-12 NOTE — Assessment & Plan Note (Signed)
Increase fluoxetine to 30 mg a day and f/u 4-6 weeks, sooner if problems. If sleep issues continue, could consider increase amitriptyline

## 2021-05-12 NOTE — Assessment & Plan Note (Signed)
rechck labs today as she has been off med 6 w

## 2021-05-13 LAB — TSH+T4F+T3FREE
Free T4: 1.03 ng/dL (ref 0.82–1.77)
T3, Free: 2.8 pg/mL (ref 2.0–4.4)
TSH: 2.57 u[IU]/mL (ref 0.450–4.500)

## 2021-05-14 ENCOUNTER — Encounter: Payer: Self-pay | Admitting: Family Medicine

## 2021-05-17 ENCOUNTER — Other Ambulatory Visit: Payer: Self-pay | Admitting: Family Medicine

## 2021-06-13 DIAGNOSIS — Z0289 Encounter for other administrative examinations: Secondary | ICD-10-CM

## 2021-06-14 ENCOUNTER — Encounter: Payer: Self-pay | Admitting: Family Medicine

## 2021-06-21 ENCOUNTER — Other Ambulatory Visit: Payer: Self-pay | Admitting: Family Medicine

## 2021-06-21 MED ORDER — BUSPIRONE HCL 5 MG PO TABS: 5.0000 mg | ORAL_TABLET | Freq: Three times a day (TID) | ORAL | 1 refills | Status: DC

## 2021-06-22 ENCOUNTER — Encounter (INDEPENDENT_AMBULATORY_CARE_PROVIDER_SITE_OTHER): Payer: Self-pay | Admitting: Family Medicine

## 2021-06-22 ENCOUNTER — Ambulatory Visit (INDEPENDENT_AMBULATORY_CARE_PROVIDER_SITE_OTHER): Payer: Managed Care, Other (non HMO) | Admitting: Family Medicine

## 2021-06-22 VITALS — BP 116/83 | HR 71 | Temp 98.3°F | Ht 62.0 in | Wt 222.0 lb

## 2021-06-22 DIAGNOSIS — Z9189 Other specified personal risk factors, not elsewhere classified: Secondary | ICD-10-CM

## 2021-06-22 DIAGNOSIS — F418 Other specified anxiety disorders: Secondary | ICD-10-CM

## 2021-06-22 DIAGNOSIS — G2581 Restless legs syndrome: Secondary | ICD-10-CM | POA: Diagnosis not present

## 2021-06-22 DIAGNOSIS — E782 Mixed hyperlipidemia: Secondary | ICD-10-CM | POA: Diagnosis not present

## 2021-06-22 DIAGNOSIS — Z6841 Body Mass Index (BMI) 40.0 and over, adult: Secondary | ICD-10-CM

## 2021-06-22 DIAGNOSIS — Z1331 Encounter for screening for depression: Secondary | ICD-10-CM | POA: Diagnosis not present

## 2021-06-22 DIAGNOSIS — E669 Obesity, unspecified: Secondary | ICD-10-CM

## 2021-06-22 DIAGNOSIS — E559 Vitamin D deficiency, unspecified: Secondary | ICD-10-CM

## 2021-06-22 DIAGNOSIS — R0602 Shortness of breath: Secondary | ICD-10-CM | POA: Diagnosis not present

## 2021-06-22 DIAGNOSIS — F411 Generalized anxiety disorder: Secondary | ICD-10-CM

## 2021-06-22 DIAGNOSIS — D508 Other iron deficiency anemias: Secondary | ICD-10-CM

## 2021-06-22 DIAGNOSIS — R5383 Other fatigue: Secondary | ICD-10-CM

## 2021-06-22 DIAGNOSIS — Z9884 Bariatric surgery status: Secondary | ICD-10-CM

## 2021-06-22 DIAGNOSIS — R7303 Prediabetes: Secondary | ICD-10-CM

## 2021-06-23 LAB — CBC WITH DIFFERENTIAL/PLATELET
Basophils Absolute: 0 10*3/uL (ref 0.0–0.2)
Basos: 0 %
EOS (ABSOLUTE): 0.2 10*3/uL (ref 0.0–0.4)
Eos: 2 %
Hemoglobin: 12.7 g/dL (ref 11.1–15.9)
Immature Grans (Abs): 0 10*3/uL (ref 0.0–0.1)
Immature Granulocytes: 0 %
Lymphocytes Absolute: 1.8 10*3/uL (ref 0.7–3.1)
Lymphs: 27 %
MCH: 30.5 pg (ref 26.6–33.0)
MCHC: 33.1 g/dL (ref 31.5–35.7)
MCV: 92 fL (ref 79–97)
Monocytes Absolute: 0.4 10*3/uL (ref 0.1–0.9)
Monocytes: 6 %
Neutrophils Absolute: 4.3 10*3/uL (ref 1.4–7.0)
Neutrophils: 65 %
Platelets: 272 10*3/uL (ref 150–450)
RBC: 4.16 x10E6/uL (ref 3.77–5.28)
RDW: 12.9 % (ref 11.7–15.4)
WBC: 6.7 10*3/uL (ref 3.4–10.8)

## 2021-06-23 LAB — ANEMIA PANEL
Ferritin: 44 ng/mL (ref 15–150)
Folate, Hemolysate: >620 ng/mL
Folate, RBC: >1615 ng/mL (ref >498)
Hematocrit: 38.4 % (ref 34.0–46.6)
Iron Saturation: 94 % (ref 15–55)
Iron: 324 ug/dL (ref 27–159)
Retic Ct Pct: 3 % — ABNORMAL HIGH (ref 0.6–2.6)
Total Iron Binding Capacity: 343 ug/dL (ref 250–450)
UIBC: 19 ug/dL — ABNORMAL LOW (ref 131–425)
Vitamin B-12: 702 pg/mL (ref 232–1245)

## 2021-06-23 LAB — MAGNESIUM: Magnesium: 2.2 mg/dL (ref 1.6–2.3)

## 2021-06-23 LAB — COMPREHENSIVE METABOLIC PANEL
ALT: 16 IU/L (ref 0–32)
AST: 16 IU/L (ref 0–40)
Albumin/Globulin Ratio: 2 (ref 1.2–2.2)
Albumin: 4.5 g/dL (ref 3.8–4.8)
Alkaline Phosphatase: 86 IU/L (ref 44–121)
BUN/Creatinine Ratio: 17 (ref 9–23)
BUN: 13 mg/dL (ref 6–24)
Bilirubin Total: 0.3 mg/dL (ref 0.0–1.2)
CO2: 23 mmol/L (ref 20–29)
Calcium: 9.6 mg/dL (ref 8.7–10.2)
Chloride: 101 mmol/L (ref 96–106)
Creatinine, Ser: 0.77 mg/dL (ref 0.57–1.00)
Globulin, Total: 2.3 g/dL (ref 1.5–4.5)
Glucose: 84 mg/dL (ref 70–99)
Potassium: 4.6 mmol/L (ref 3.5–5.2)
Sodium: 139 mmol/L (ref 134–144)
Total Protein: 6.8 g/dL (ref 6.0–8.5)
eGFR: 99 mL/min/{1.73_m2} (ref >59)

## 2021-06-23 LAB — LIPID PANEL WITH LDL/HDL RATIO
Cholesterol, Total: 237 mg/dL — ABNORMAL HIGH (ref 100–199)
HDL: 55 mg/dL (ref >39)
LDL Chol Calc (NIH): 151 mg/dL — ABNORMAL HIGH (ref 0–99)
LDL/HDL Ratio: 2.7 ratio (ref 0.0–3.2)
Triglycerides: 173 mg/dL — ABNORMAL HIGH (ref 0–149)
VLDL Cholesterol Cal: 31 mg/dL (ref 5–40)

## 2021-06-23 LAB — FOLATE: Folate: >20 ng/mL (ref >3.0)

## 2021-06-23 LAB — INSULIN, RANDOM: INSULIN: 17.1 u[IU]/mL (ref 2.6–24.9)

## 2021-06-23 LAB — VITAMIN D 25 HYDROXY (VIT D DEFICIENCY, FRACTURES): Vit D, 25-Hydroxy: 52.5 ng/mL (ref 30.0–100.0)

## 2021-06-23 LAB — PREALBUMIN: PREALBUMIN: 29 mg/dL (ref 12–34)

## 2021-06-23 LAB — HEMOGLOBIN A1C
Est. average glucose Bld gHb Est-mCnc: 117 mg/dL
Hgb A1c MFr Bld: 5.7 % — ABNORMAL HIGH (ref 4.8–5.6)

## 2021-06-23 NOTE — Progress Notes (Signed)
? ? ? ?Chief Complaint:  ? ?OBESITY ?Kara Peters (MR# [id number]) is a 41 y.o. female who presents for evaluation and treatment of obesity and related comorbidities. Current BMI is Body mass index is 40.6 kg/m?Kara Peters has been struggling with her weight for many years and has been unsuccessful in either losing weight, maintaining weight loss, or reaching her healthy weight goal. ? ?Kara Peters was referred by a coworker. She had Gastric Sleeve in 2018 not much restriction left. 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 She lost to 172 in 3 months post op. She skips breakfast frequently. Coffee with 1 tsp. Breakfast-2 eggs or 1 oatmeal packet (flavored) (satisfied). Lunch-leftovers (hamburger helper)or sandwich -chicken(3 slices) and cheese (1 slice) with mustard plus individually bag pop chips. Snack at 2 pm-another bag chips or cheez its. Dinner-hamburger helps or stew in crock pot or chicken (4.5 ounces) plus seasoning with 1 cup of side (full) sometimes after dinner snack. [place]asso bars or wafer cookies.  ? ?Kara Peters is currently in the action stage of change and ready to dedicate time achieving and maintaining a healthier weight. [name] is interested in becoming our patient and working on intensive lifestyle modifications including (but not limited to) diet and exercise for weight loss. ? ?[name] habits were reviewed today and are as follows: Her family eats meals together, she thinks her family will eat healthier with her, her desired weight loss is 72 pounds, she has been heavy most of her life, she started gaining weight in her mid 20's, her heaviest weight ever was 230 pounds, she has significant food cravings issues, she snacks frequently in the evenings, she skips meals frequently, she frequently makes poor food choices, she has problems with excessive hunger, she frequently eats larger portions than normal, she has binge eating behaviors, and she struggles with emotional eating. ? ?Depression Screen ?Kara Peters's Food  and Mood (modified PHQ-9) score was 14. ? ? ?  06/22/2021  ?  7:28 AM  ?Depression screen PHQ 2/9  ?Decreased Interest 2  ?Down, Depressed, Hopeless 2  ?PHQ - 2 Score 4  ?Altered sleeping 2  ?Tired, decreased energy 3  ?Change in appetite 2  ?Feeling bad or failure about yourself  1  ?Trouble concentrating 2  ?Moving slowly or fidgety/restless 0  ?Suicidal thoughts 0  ?PHQ-9 Score 14  ?Difficult doing work/chores Somewhat difficult  ? ?Subjective:  ? ?1. Other fatigue ?We reviewed EKG from 01/16/2021. It showed normal sinus rhythm at 78 bpm.Kara Peters admits to daytime somnolence and admits to waking up still tired. Patient has a history of symptoms of morning fatigue and morning headache. [name] generally gets 5 or 6 hours of sleep per night, and states that she has difficulty falling asleep. Snoring is present. Apneic episodes are not present. Epworth Sleepiness Score is 4.   ? ?2. Shortness of breath on exertion ?We reviewed EKG from 01/16/2021. It showed normal sinus rhythm at 78 bpm.Kara Peters notes increasing shortness of breath with exercising and seems to be worsening over time with weight gain. She notes getting out of breath sooner with activity than she used to. This has not gotten worse recently. Kara Peters denies shortness of breath at rest or orthopnea.  ? ?3. Prediabetes ?Kara Peters's A1C has been elevated for 5 years. She is not on medications.  ? ?4. Mixed hyperlipidemia ?Kara Peters's last LDL 153. Her Triglycerides was 238. Her HDL was 48. She is not on medications. It has been elevated for years.  ? ?5. S/P bariatric surgery ?Kara Peters had  Gastric Sleeve in 2018. ? ?6. Restless legs ?Kara Peters's symptoms is fairly significant.  ? ?7. Other iron deficiency anemia ?Kara Peters has had iron transfusion in the past. She is on iron by mouth and multivitamins.  ? ?8. Vitamin D deficiency ?Kara Peters is on over the counter Vitamin D. She notes fatigue. ? ?9. Generalized anxiety disorder ?Kara Peters is on Prozac and Buspar. She feels fairly well managed.   ? ?10. At risk for deficient intake of food ?The patient is at a higher than average risk of deficient intake of food due to not eating enough.  ? ?Assessment/Plan:  ? ?1. Other fatigue ?We will check IC and labs today. Kara Peters does feel that her weight is causing her energy to be lower than it should be. Fatigue may be related to obesity, depression or many other causes. Labs will be ordered, and in the meanwhile, Kara Peters will focus on self care including making healthy food choices, increasing physical activity and focusing on stress reduction.  ? ?2. Shortness of breath on exertion ?We will check IC and labs today. Kara Peters does feel that she gets out of breath more easily that she used to when she exercises. Kara Peters's shortness of breath appears to be obesity related and exercise induced. She has agreed to work on weight loss and gradually increase exercise to treat her exercise induced shortness of breath. Will continue to monitor closely.  ? ?3. Prediabetes ?We will check A1C and insulin today. Kara Peters will continue to work on weight loss, exercise, and decreasing simple carbohydrates to help decrease the risk of diabetes.  ? ?- Hemoglobin A1c ?- Comprehensive metabolic panel ?- Insulin, random ? ?4. Mixed hyperlipidemia ?Cardiovascular risk and specific lipid/LDL goals reviewed.  We will check FLP today. We discussed several lifestyle modifications today and Kara Peters will continue to work on diet, exercise and weight loss efforts. Orders and follow up as documented in patient record.  ? ?Counseling ?Intensive lifestyle modifications are the first line treatment for this issue. ?Dietary changes: Increase soluble fiber. Decrease simple carbohydrates. ?Exercise changes: Moderate to vigorous-intensity aerobic activity 150 minutes per week if tolerated. ?Lipid-lowering medications: see documented in medical record. ? ?- Lipid Panel With LDL/HDL Ratio ? ?5. S/P bariatric surgery ?We will check Prealbumin today.  ? ?-  Prealbumin ? ?6. Restless legs ?Lucill's level is managed today. We will check labs today.  ? ?- Magnesium ? ?7. Other iron deficiency anemia ?Orders and follow up as documented in patient record. We will check CBC and Anemia panel today.  ? ?Counseling ?Iron is essential for our bodies to make red blood cells.  Reasons that someone may be deficient include: an iron-deficient diet (more likely in those following vegan or vegetarian diets), women with heavy menses, patients with GI disorders or poor absorption, patients that have had bariatric surgery, frequent blood donors, patients with cancer, and patients with heart disease.   ?An iron supplement has been recommended. This is found over-the-counter.  ?Iron-rich foods include dark leafy greens, red and white meats, eggs, seafood, and beans.   ?Certain foods and drinks prevent your body from absorbing iron properly. Avoid eating these foods in the same meal as iron-rich foods or with iron supplements. These foods include: coffee, black tea, and red wine; milk, dairy products, and foods that are high in calcium; beans and soybeans; whole grains.  ?Constipation can be a side effect of iron supplementation. Increased water and fiber intake are helpful. Water goal: > 2 liters/day. Fiber goal: > 25 grams/day.  ? ?-  Folate ?- CBC with Differential/Platelet ?- Vitamin B12 ?- Anemia panel ? ?8. Vitamin D deficiency ?Low Vitamin D level contributes to fatigue and are associated with obesity, breast, and colon cancer. We will check Vitamin D level today and Raima will follow-up for routine testing of Vitamin D, at least 2-3 times per year to avoid over-replacement. ? ?- VITAMIN D 25 Hydroxy (Vit-D Deficiency, Fractures) ? ?9. Generalized anxiety disorder ?Kiesha will continue with currently medications. She will change in dose. We will follow up symptoms at next appointment. Behavior modification techniques were discussed today to help Blimi deal with her emotional/non-hunger  eating behaviors.  Orders and follow up as documented in patient record.  ? ?10. Depression screening ?Gredmarie had a positive depression screening. Depression is commonly associated with obesity and often results in

## 2021-07-06 ENCOUNTER — Ambulatory Visit (INDEPENDENT_AMBULATORY_CARE_PROVIDER_SITE_OTHER): Payer: Managed Care, Other (non HMO) | Admitting: Family Medicine

## 2021-07-06 ENCOUNTER — Encounter (INDEPENDENT_AMBULATORY_CARE_PROVIDER_SITE_OTHER): Payer: Self-pay | Admitting: Family Medicine

## 2021-07-06 ENCOUNTER — Ambulatory Visit (INDEPENDENT_AMBULATORY_CARE_PROVIDER_SITE_OTHER): Payer: Self-pay | Admitting: Family Medicine

## 2021-07-06 VITALS — BP 106/73 | HR 75 | Temp 98.6°F | Ht 62.0 in | Wt 217.0 lb

## 2021-07-06 DIAGNOSIS — E669 Obesity, unspecified: Secondary | ICD-10-CM

## 2021-07-06 DIAGNOSIS — Z9189 Other specified personal risk factors, not elsewhere classified: Secondary | ICD-10-CM

## 2021-07-06 DIAGNOSIS — R7303 Prediabetes: Secondary | ICD-10-CM | POA: Diagnosis not present

## 2021-07-06 DIAGNOSIS — E782 Mixed hyperlipidemia: Secondary | ICD-10-CM

## 2021-07-06 DIAGNOSIS — Z9884 Bariatric surgery status: Secondary | ICD-10-CM

## 2021-07-06 DIAGNOSIS — E559 Vitamin D deficiency, unspecified: Secondary | ICD-10-CM | POA: Diagnosis not present

## 2021-07-06 DIAGNOSIS — Z6839 Body mass index (BMI) 39.0-39.9, adult: Secondary | ICD-10-CM

## 2021-07-14 NOTE — Progress Notes (Signed)
Chief Complaint:   OBESITY Kara Peters is here to discuss her progress with her obesity treatment plan along with follow-up of her obesity related diagnoses. Kara Peters is on the Category 3 Plan and states she is following her eating plan approximately 100% of the time. Kara Peters states she is walking 30 minutes 2 times per week.  Today's visit was #: 2 Starting weight: 222 lbs Starting date: 06/22/2021 Today's weight: 217 lbs Today's date: 07/06/2021 Total lbs lost to date: 5 Total lbs lost since last in-office visit: 5  Interim History: Kara Peters's food plan went well over the first few weeks. She felt the dinner portion was a huge portion/ Kara Peters doesn't think that she drank enough water. She does feel better overall, as she is sleeping better and notices her clothes are fitting differently. Kara Peters is not necessarily hungry. Her snack calories have been laughing cow cheese and crackers, Kind bars, 1 tablespoon of peanut butter and celery.  Subjective:   1. Mixed hyperlipidemia Kara Peters's HDL is at goal at 55u, her LDL is 15, and triglycerides are 173. Her 10 year ASCVD risk is 0.6% and optimal is 0.4%.  2. S/P bariatric surgery Kara Peters is taking a daily multivitamin and an iron supplement. She has a significantly elevated iron and TIBC.  3. Vitamin D deficiency Kara Peters's vitamin D level was 52.5 and she is on OTC 1,000IU per day. She notes fatigue.  4. Prediabetes Kara Peters's A1c was 5.7 and her Insulin was 17.1. She was diagnosed approximately 5 years ago, as her A1c was 5.8 in 2017. She has had improved carb cravings over the first 2 weeks.  5. At risk of diabetes mellitus Kara Peters is at risk for diabetes mellitus due to her diagnosis of prediabetes.  Assessment/Plan:   1. Mixed hyperlipidemia Cardiovascular risk and specific lipid/LDL goals reviewed.  We discussed several lifestyle modifications today and Tiyonna will continue to work on diet, exercise and weight loss efforts. Orders and follow up as  documented in patient record. We will follow up her FLP in 3 months and Kara Peters agrees to continue the Category 3 meal plan.  Counseling Intensive lifestyle modifications are the first line treatment for this issue. Dietary changes: Increase soluble fiber. Decrease simple carbohydrates. Exercise changes: Moderate to vigorous-intensity aerobic activity 150 minutes per week if tolerated. Lipid-lowering medications: see documented in medical record.  2. S/P bariatric surgery Kara Peters was told to stop supplemental iron and we weill retest labs in 3 months.  3. Vitamin D deficiency Low Vitamin D level contributes to fatigue and are associated with obesity, breast, and colon cancer. She agrees to continue to take OTC Vitamin D with no change in the dose and will follow-up for routine testing of Vitamin D, at least 2-3 times per year to avoid over-replacement. We will repeat labs in 6 months.  4. Prediabetes Kara Peters will continue to work on weight loss, exercise, and decreasing simple carbohydrates to help decrease the risk of diabetes. Labs will be repeated in 3 months.  5. At risk of diabetes mellitus Kara Peters was given approximately 15 minutes of diabetic education and counseling today. We discussed intensive lifestyle modifications today with an emphasis on weight loss as well as increasing exercise and decreasing simple carbohydrates in her diet. We also reviewed medication options with an emphasis on risk versus benefits of those discussed.  Repetitive spaced learning was employed today to elicit superior memory formation and behavioral change.   6. Obesity with current BMI of 39.8 Kara Peters is currently in  the action stage of change. As such, her goal is to continue with weight loss efforts. She has agreed to the Category 3 Plan.   Exercise goals: All adults should avoid inactivity. Some physical activity is better than none, and adults who participate in any amount of physical activity gain some health  benefits.  Behavioral modification strategies: increasing lean protein intake, meal planning and cooking strategies, keeping healthy foods in the home, and planning for success.  Kara Peters has agreed to follow-up with our clinic in 4 weeks. She was informed of the importance of frequent follow-up visits to maximize her success with intensive lifestyle modifications for her multiple health conditions.   Objective:   Blood pressure 106/73, pulse 75, temperature 98.6 F (37 C), height 5\' 2"  (1.575 m), weight 217 lb (98.4 kg), last menstrual period 06/13/2021, SpO2 98 %. Body mass index is 39.69 kg/m.  General: Cooperative, alert, well developed, in no acute distress. HEENT: Conjunctivae and lids unremarkable. Cardiovascular: Regular rhythm.  Lungs: Normal work of breathing. Neurologic: No focal deficits.   Lab Results  Component Value Date   CREATININE 0.77 06/22/2021   BUN 13 06/22/2021   NA 139 06/22/2021   K 4.6 06/22/2021   CL 101 06/22/2021   CO2 23 06/22/2021   Lab Results  Component Value Date   ALT 16 06/22/2021   AST 16 06/22/2021   ALKPHOS 86 06/22/2021   BILITOT 0.3 06/22/2021   Lab Results  Component Value Date   HGBA1C 5.7 (H) 06/22/2021   Lab Results  Component Value Date   INSULIN 17.1 06/22/2021   Lab Results  Component Value Date   TSH 2.570 05/12/2021   Lab Results  Component Value Date   CHOL 237 (H) 06/22/2021   HDL 55 06/22/2021   LDLCALC 151 (H) 06/22/2021   TRIG 173 (H) 06/22/2021   CHOLHDL 4.0 03/18/2021   Lab Results  Component Value Date   VD25OH 52.5 06/22/2021   Lab Results  Component Value Date   WBC 6.7 06/22/2021   HGB 12.7 06/22/2021   HCT 38.4 06/22/2021   MCV 92 06/22/2021   PLT 272 06/22/2021   Lab Results  Component Value Date   IRON 324 (HH) 06/22/2021   TIBC 343 06/22/2021   FERRITIN 44 06/22/2021   Attestation Statements:   Reviewed by clinician on day of visit: allergies, medications, problem list, medical  history, surgical history, family history, social history, and previous encounter notes.  I06/06/2021, CMA, am acting as transcriptionist for Kirke Corin, MD I have reviewed the above documentation for accuracy and completeness, and I agree with the above. - Reuben Likes, MD

## 2021-07-29 ENCOUNTER — Encounter: Payer: Self-pay | Admitting: Family Medicine

## 2021-07-29 ENCOUNTER — Other Ambulatory Visit: Payer: Self-pay | Admitting: Family Medicine

## 2021-07-30 ENCOUNTER — Other Ambulatory Visit: Payer: Self-pay | Admitting: Family Medicine

## 2021-07-30 MED ORDER — TRAZODONE HCL 100 MG PO TABS: 100.0000 mg | ORAL_TABLET | Freq: Every day | ORAL | 1 refills | Status: DC

## 2021-08-04 ENCOUNTER — Encounter (INDEPENDENT_AMBULATORY_CARE_PROVIDER_SITE_OTHER): Payer: Self-pay | Admitting: Family Medicine

## 2021-08-04 ENCOUNTER — Ambulatory Visit (INDEPENDENT_AMBULATORY_CARE_PROVIDER_SITE_OTHER): Payer: Managed Care, Other (non HMO) | Admitting: Family Medicine

## 2021-08-04 VITALS — BP 114/72 | HR 68 | Temp 98.0°F | Ht 62.0 in | Wt 216.0 lb

## 2021-08-04 DIAGNOSIS — Z6839 Body mass index (BMI) 39.0-39.9, adult: Secondary | ICD-10-CM | POA: Diagnosis not present

## 2021-08-04 DIAGNOSIS — E669 Obesity, unspecified: Secondary | ICD-10-CM | POA: Diagnosis not present

## 2021-08-04 DIAGNOSIS — E7849 Other hyperlipidemia: Secondary | ICD-10-CM

## 2021-08-07 NOTE — Progress Notes (Signed)
Chief Complaint:   OBESITY Kara Peters is here to discuss her progress with her obesity treatment plan along with follow-up of her obesity related diagnoses. Kara Peters is on the Category 3 Plan and states she is following her eating plan approximately 90% of the time. Kara Peters states she is not exercising.  Today's visit was #: 3 Starting weight: 222 lbs Starting date: 06/22/2021 Today's weight: 216 lbs Today's date: 08/04/2021 Total lbs lost to date: 6 lbs Total lbs lost since last in-office visit: 1lb  Interim History: Kara Peters has been mostly living typical life. She has been sticking to meal plan almost completely except for a hot dog while camping.  She has been otherwise doing mostly sandwiches for lunch, yogurt or eggs for breakfast.  Her son is graduating high school on Friday.  She feels food quantity is sufficient.  Kara Peters had about a 3 lb water gain.   Subjective:   1. Other hyperlipidemia Kara Peters's last LDL 151, HDL 55, Triglycerides 173.  Kara Peters is currently not on any medications    Assessment/Plan:   1. Other hyperlipidemia Kara Peters has agreed to repeat fasting labs in August.   2. Obesity with current BMI of 39.5 Kara Peters is currently in the action stage of change. As such, her goal is to continue with weight loss efforts. She has agreed to the Category 3 Plan.   Exercise goals: No exercise has been prescribed at this time.  Behavioral modification strategies: increasing lean protein intake, meal planning and cooking strategies, keeping healthy foods in the home, and planning for success.  Kara Peters has agreed to follow-up with our clinic in 3 weeks. She was informed of the importance of frequent follow-up visits to maximize her success with intensive lifestyle modifications for her multiple health conditions.   Objective:   Blood pressure 114/72, pulse 68, temperature 98 F (36.7 C), height 5\' 2"  (1.575 m), weight 216 lb (98 kg), last menstrual period 07/10/2021, SpO2 98 %. Body  mass index is 39.51 kg/m.  General: Cooperative, alert, well developed, in no acute distress. HEENT: Conjunctivae and lids unremarkable. Cardiovascular: Regular rhythm.  Lungs: Normal work of breathing. Neurologic: No focal deficits.   Lab Results  Component Value Date   CREATININE 0.77 06/22/2021   BUN 13 06/22/2021   NA 139 06/22/2021   K 4.6 06/22/2021   CL 101 06/22/2021   CO2 23 06/22/2021   Lab Results  Component Value Date   ALT 16 06/22/2021   AST 16 06/22/2021   ALKPHOS 86 06/22/2021   BILITOT 0.3 06/22/2021   Lab Results  Component Value Date   HGBA1C 5.7 (H) 06/22/2021   Lab Results  Component Value Date   INSULIN 17.1 06/22/2021   Lab Results  Component Value Date   TSH 2.570 05/12/2021   Lab Results  Component Value Date   CHOL 237 (H) 06/22/2021   HDL 55 06/22/2021   LDLCALC 151 (H) 06/22/2021   TRIG 173 (H) 06/22/2021   CHOLHDL 4.0 03/18/2021   Lab Results  Component Value Date   VD25OH 52.5 06/22/2021   Lab Results  Component Value Date   WBC 6.7 06/22/2021   HGB 12.7 06/22/2021   HCT 38.4 06/22/2021   MCV 92 06/22/2021   PLT 272 06/22/2021   Lab Results  Component Value Date   IRON 324 (HH) 06/22/2021   TIBC 343 06/22/2021   FERRITIN 44 06/22/2021    Attestation Statements:   Reviewed by clinician on day of visit: allergies, medications, problem  list, medical history, surgical history, family history, social history, and previous encounter notes.  I, Malcolm Metro, RMA, am acting as transcriptionist for Reuben Likes, MD.  I have reviewed the above documentation for accuracy and completeness, and I agree with the above. - Reuben Likes, MD

## 2021-08-23 ENCOUNTER — Encounter (INDEPENDENT_AMBULATORY_CARE_PROVIDER_SITE_OTHER): Payer: Self-pay | Admitting: Family Medicine

## 2021-08-23 ENCOUNTER — Ambulatory Visit (INDEPENDENT_AMBULATORY_CARE_PROVIDER_SITE_OTHER): Payer: Managed Care, Other (non HMO) | Admitting: Family Medicine

## 2021-08-23 VITALS — BP 95/64 | HR 77 | Temp 98.3°F | Ht 62.0 in | Wt 211.0 lb

## 2021-08-23 DIAGNOSIS — E669 Obesity, unspecified: Secondary | ICD-10-CM | POA: Diagnosis not present

## 2021-08-23 DIAGNOSIS — E7849 Other hyperlipidemia: Secondary | ICD-10-CM

## 2021-08-23 DIAGNOSIS — Z6838 Body mass index (BMI) 38.0-38.9, adult: Secondary | ICD-10-CM | POA: Diagnosis not present

## 2021-08-24 ENCOUNTER — Encounter: Payer: Self-pay | Admitting: *Deleted

## 2021-08-26 NOTE — Progress Notes (Unsigned)
Chief Complaint:   OBESITY Kara Peters is here to discuss her progress with her obesity treatment plan along with follow-up of her obesity related diagnoses. Kara Peters is on the Category 3 Plan and states she is following her eating plan approximately 90% of the time. Kara Peters states she is walking 30 minutes 2 times per week.  Today's visit was #: 4 Starting weight: 222 lbs Starting date: 06/22/2021 Today's weight: 211 lbs Today's date: 08/23/2021 Total lbs lost to date: 11 lbs Total lbs lost since last in-office visit: 6  Interim History: Bhavana went camping this past weekend and slightly deviated off plan. She is getting hungry at dinner and is craving peanut butter. She has no upcoming plans except class starting next week. She is going to [place] for a few days in July. Noticing loss of inches more than weight loss.  Subjective:   1. Other hyperlipidemia Kara Peters not on any medication. Her last LDL of 151, HDL of 55 and Trigly 173.  Assessment/Plan:   1. Other hyperlipidemia Kara Peters will continue on Cat 3 plan and repeat FLP lab in 2 months.  2. Obesity with current BMI of 38.7 Kara Peters is currently in the action stage of change. As such, her goal is to continue with weight loss efforts. She has agreed to the Category 3 Plan + 100.  Exercise goals: All adults should avoid inactivity. Some physical activity is better than none, and adults who participate in any amount of physical activity gain some health benefits.  Behavioral modification strategies: increasing lean protein intake, meal planning and cooking strategies, keeping healthy foods in the home, and planning for success.  Kara Peters has agreed to follow-up with our clinic in 3 weeks. She was informed of the importance of frequent follow-up visits to maximize her success with intensive lifestyle modifications for her multiple health conditions.   Objective:   Blood pressure 95/64, pulse 77, temperature 98.3 F (36.8 C), height 5\' 2"   (1.575 m), weight 211 lb (95.7 kg), last menstrual period 08/06/2021, SpO2 98 %. Body mass index is 38.59 kg/m.  General: Cooperative, alert, well developed, in no acute distress. HEENT: Conjunctivae and lids unremarkable. Cardiovascular: Regular rhythm.  Lungs: Normal work of breathing. Neurologic: No focal deficits.   Lab Results  Component Value Date   CREATININE 0.77 06/22/2021   BUN 13 06/22/2021   NA 139 06/22/2021   K 4.6 06/22/2021   CL 101 06/22/2021   CO2 23 06/22/2021   Lab Results  Component Value Date   ALT 16 06/22/2021   AST 16 06/22/2021   ALKPHOS 86 06/22/2021   BILITOT 0.3 06/22/2021   Lab Results  Component Value Date   HGBA1C 5.7 (H) 06/22/2021   Lab Results  Component Value Date   INSULIN 17.1 06/22/2021   Lab Results  Component Value Date   TSH 2.570 05/12/2021   Lab Results  Component Value Date   CHOL 237 (H) 06/22/2021   HDL 55 06/22/2021   LDLCALC 151 (H) 06/22/2021   TRIG 173 (H) 06/22/2021   CHOLHDL 4.0 03/18/2021   Lab Results  Component Value Date   VD25OH 52.5 06/22/2021   Lab Results  Component Value Date   WBC 6.7 06/22/2021   HGB 12.7 06/22/2021   HCT 38.4 06/22/2021   MCV 92 06/22/2021   PLT 272 06/22/2021   Lab Results  Component Value Date   IRON 324 (HH) 06/22/2021   TIBC 343 06/22/2021   FERRITIN 44 06/22/2021   Attestation Statements:  Reviewed by clinician on day of visit: allergies, medications, problem list, medical history, surgical history, family history, social history, and previous encounter notes.  I, Elnora Morrison, RMA am acting as transcriptionist for Coralie Common, MD.  I have reviewed the above documentation for accuracy and completeness, and I agree with the above. -  ***

## 2021-09-13 ENCOUNTER — Telehealth (INDEPENDENT_AMBULATORY_CARE_PROVIDER_SITE_OTHER): Payer: Managed Care, Other (non HMO) | Admitting: Family Medicine

## 2021-09-13 ENCOUNTER — Encounter (INDEPENDENT_AMBULATORY_CARE_PROVIDER_SITE_OTHER): Payer: Self-pay | Admitting: Family Medicine

## 2021-09-13 DIAGNOSIS — Z6838 Body mass index (BMI) 38.0-38.9, adult: Secondary | ICD-10-CM | POA: Diagnosis not present

## 2021-09-13 DIAGNOSIS — E669 Obesity, unspecified: Secondary | ICD-10-CM | POA: Diagnosis not present

## 2021-09-13 DIAGNOSIS — E7849 Other hyperlipidemia: Secondary | ICD-10-CM | POA: Diagnosis not present

## 2021-09-15 NOTE — Progress Notes (Signed)
TeleHealth Visit:  Due to the COVID-19 pandemic, this visit was completed with telemedicine (audio/video) technology to reduce patient and provider exposure as well as to preserve personal protective equipment.   Kara Peters has verbally consented to this TeleHealth visit. The patient is located at home, the provider is located at the [facility] office. The participants in this visit include the listed provider and patient. The visit was conducted today via MyChart video.   Chief Complaint: OBESITY Kara Peters is here to discuss her progress with her obesity treatment plan along with follow-up of her obesity related diagnoses. Kara Peters is on the Category 3 Plan and states she is following her eating plan approximately 90% of the time. Kara Peters states she is doing cardio 15 minutes 3 times per week.  Today's visit was #: 5 Starting weight: 222 lbs Starting date: 06/22/2021  Interim History: Kara Peters reports she has congestion and a low grade temperature today. She has been trying to stay on Cat 3 more frequently and increase protein at supper. At 1:30-@pm , she has desire to snack as well as after supper. She eats snacks at those times. No plans over the 4th of July. She has started [employer]. She reports weight of 210 lbs this am. She is going to [employer] next month.  Subjective:   1. Other hyperlipidemia Kara Peters's last LDL of 151, HDL of 55, and Trigly 173. She is not currently on statin.  Assessment/Plan:   1. Other hyperlipidemia We will obtain labs in 2 months.  2. Obesity with current BMI of 38.5 Kara Peters is currently in the action stage of change. As such, her goal is to continue with weight loss efforts. She has agreed to the Category 3 Plan +100.  Exercise goals: Kara Peters is to add [employer] 20 minute workout 3 times a week  Behavioral modification strategies: increasing lean protein intake, meal planning and cooking strategies, keeping healthy  foods in the home, and planning for success.  Kara Peters has agreed to follow-up with our clinic in 4 weeks. She was informed of the importance of frequent follow-up visits to maximize her success with intensive lifestyle modifications for her multiple health conditions.  Objective:   VITALS: Per patient if applicable, see vitals. GENERAL: Alert and in no acute distress. CARDIOPULMONARY: No increased WOB. Speaking in clear sentences.  PSYCH: Pleasant and cooperative. Speech normal rate and rhythm. Affect is appropriate. Insight and judgement are appropriate. Attention is focused, linear, and appropriate.  NEURO: Oriented as arrived to appointment on time with no prompting.   Lab Results  Component Value Date   CREATININE 0.77 06/22/2021   BUN 13 06/22/2021   NA 139 06/22/2021   K 4.6 06/22/2021   CL 101 06/22/2021   CO2 23 06/22/2021   Lab Results  Component Value Date   ALT 16 06/22/2021   AST 16 06/22/2021   ALKPHOS 86 06/22/2021   BILITOT 0.3 06/22/2021   Lab Results  Component Value Date   HGBA1C 5.7 (H) 06/22/2021   Lab Results  Component Value Date   INSULIN 17.1 06/22/2021   Lab Results  Component Value Date   TSH 2.570 05/12/2021   Lab Results  Component Value Date   CHOL 237 (H) 06/22/2021   HDL 55 06/22/2021   LDLCALC 151 (H) 06/22/2021   TRIG 173 (H) 06/22/2021   CHOLHDL 4.0 03/18/2021   Lab Results  Component Value Date   VD25OH 52.5 06/22/2021   Lab Results  Component Value  Date   WBC 6.7 06/22/2021   HGB 12.7 06/22/2021   HCT 38.4 06/22/2021   MCV 92 06/22/2021   PLT 272 06/22/2021   Lab Results  Component Value Date   IRON 324 (HH) 06/22/2021   TIBC 343 06/22/2021   FERRITIN 44 06/22/2021    Attestation Statements:   Reviewed by clinician on day of visit: allergies, medications, problem list, medical history, surgical history, family history, social history, and previous encounter notes.  I, Fortino Sic, RMA am acting as  transcriptionist for Reuben Likes, MD.  I have reviewed the above documentation for accuracy and completeness, and I agree with the above. - Reuben Likes, MD

## 2021-09-20 ENCOUNTER — Other Ambulatory Visit: Payer: Self-pay | Admitting: Family Medicine

## 2021-09-24 DIAGNOSIS — N898 Other specified noninflammatory disorders of vagina: Secondary | ICD-10-CM | POA: Diagnosis not present

## 2021-09-24 DIAGNOSIS — N920 Excessive and frequent menstruation with regular cycle: Secondary | ICD-10-CM | POA: Diagnosis not present

## 2021-09-24 DIAGNOSIS — R102 Pelvic and perineal pain: Secondary | ICD-10-CM | POA: Diagnosis not present

## 2021-10-05 ENCOUNTER — Ambulatory Visit (INDEPENDENT_AMBULATORY_CARE_PROVIDER_SITE_OTHER): Payer: Managed Care, Other (non HMO) | Admitting: Family Medicine

## 2021-10-12 ENCOUNTER — Other Ambulatory Visit: Payer: Self-pay | Admitting: Family Medicine

## 2021-10-27 ENCOUNTER — Encounter (INDEPENDENT_AMBULATORY_CARE_PROVIDER_SITE_OTHER): Payer: Self-pay

## 2021-11-03 DIAGNOSIS — N941 Unspecified dyspareunia: Secondary | ICD-10-CM | POA: Diagnosis not present

## 2021-11-03 DIAGNOSIS — N920 Excessive and frequent menstruation with regular cycle: Secondary | ICD-10-CM | POA: Diagnosis not present

## 2021-11-03 DIAGNOSIS — R102 Pelvic and perineal pain: Secondary | ICD-10-CM | POA: Diagnosis not present

## 2021-11-03 DIAGNOSIS — Z8041 Family history of malignant neoplasm of ovary: Secondary | ICD-10-CM | POA: Diagnosis not present

## 2021-11-04 DIAGNOSIS — L739 Follicular disorder, unspecified: Secondary | ICD-10-CM | POA: Diagnosis not present

## 2021-11-04 DIAGNOSIS — D225 Melanocytic nevi of trunk: Secondary | ICD-10-CM | POA: Diagnosis not present

## 2021-11-04 DIAGNOSIS — L578 Other skin changes due to chronic exposure to nonionizing radiation: Secondary | ICD-10-CM | POA: Diagnosis not present

## 2021-11-04 DIAGNOSIS — L820 Inflamed seborrheic keratosis: Secondary | ICD-10-CM | POA: Diagnosis not present

## 2021-11-07 ENCOUNTER — Other Ambulatory Visit: Payer: Self-pay | Admitting: Family Medicine

## 2021-11-11 DIAGNOSIS — N840 Polyp of corpus uteri: Secondary | ICD-10-CM | POA: Diagnosis not present

## 2021-11-11 DIAGNOSIS — Z8041 Family history of malignant neoplasm of ovary: Secondary | ICD-10-CM | POA: Diagnosis not present

## 2021-11-14 DIAGNOSIS — J019 Acute sinusitis, unspecified: Secondary | ICD-10-CM | POA: Diagnosis not present

## 2021-11-14 DIAGNOSIS — Z20822 Contact with and (suspected) exposure to covid-19: Secondary | ICD-10-CM | POA: Diagnosis not present

## 2021-11-17 DIAGNOSIS — J209 Acute bronchitis, unspecified: Secondary | ICD-10-CM | POA: Diagnosis not present

## 2021-11-17 DIAGNOSIS — N920 Excessive and frequent menstruation with regular cycle: Secondary | ICD-10-CM | POA: Diagnosis not present

## 2021-11-17 DIAGNOSIS — N9489 Other specified conditions associated with female genital organs and menstrual cycle: Secondary | ICD-10-CM | POA: Diagnosis not present

## 2021-11-17 DIAGNOSIS — J019 Acute sinusitis, unspecified: Secondary | ICD-10-CM | POA: Diagnosis not present

## 2021-11-23 ENCOUNTER — Telehealth: Payer: Self-pay | Admitting: *Deleted

## 2021-11-23 MED ORDER — FLUOXETINE HCL 20 MG PO CAPS: 20.0000 mg | ORAL_CAPSULE | Freq: Every day | ORAL | 3 refills | Status: DC

## 2021-11-23 NOTE — Telephone Encounter (Signed)
Received fax from pharmacy stating that pt needs a refill on her fluoxetine.  The note states that insurance is requiring capsules instead of tablets.  Please send in new prescription.Kara Peters, CMA

## 2021-12-04 ENCOUNTER — Other Ambulatory Visit: Payer: Self-pay | Admitting: Family Medicine

## 2021-12-24 DIAGNOSIS — N920 Excessive and frequent menstruation with regular cycle: Secondary | ICD-10-CM | POA: Diagnosis not present

## 2021-12-24 DIAGNOSIS — D261 Other benign neoplasm of corpus uteri: Secondary | ICD-10-CM | POA: Diagnosis not present

## 2021-12-24 DIAGNOSIS — N840 Polyp of corpus uteri: Secondary | ICD-10-CM | POA: Diagnosis not present

## 2021-12-31 DIAGNOSIS — N920 Excessive and frequent menstruation with regular cycle: Secondary | ICD-10-CM | POA: Diagnosis not present

## 2021-12-31 DIAGNOSIS — N9489 Other specified conditions associated with female genital organs and menstrual cycle: Secondary | ICD-10-CM | POA: Diagnosis not present

## 2022-01-17 DIAGNOSIS — M62838 Other muscle spasm: Secondary | ICD-10-CM | POA: Diagnosis not present

## 2022-01-17 DIAGNOSIS — M50322 Other cervical disc degeneration at C5-C6 level: Secondary | ICD-10-CM | POA: Diagnosis not present

## 2022-01-17 DIAGNOSIS — M9901 Segmental and somatic dysfunction of cervical region: Secondary | ICD-10-CM | POA: Diagnosis not present

## 2022-01-17 DIAGNOSIS — M5412 Radiculopathy, cervical region: Secondary | ICD-10-CM | POA: Diagnosis not present

## 2022-01-18 DIAGNOSIS — M9901 Segmental and somatic dysfunction of cervical region: Secondary | ICD-10-CM | POA: Diagnosis not present

## 2022-01-18 DIAGNOSIS — M62838 Other muscle spasm: Secondary | ICD-10-CM | POA: Diagnosis not present

## 2022-01-18 DIAGNOSIS — M5412 Radiculopathy, cervical region: Secondary | ICD-10-CM | POA: Diagnosis not present

## 2022-01-18 DIAGNOSIS — M50322 Other cervical disc degeneration at C5-C6 level: Secondary | ICD-10-CM | POA: Diagnosis not present

## 2022-01-19 DIAGNOSIS — M62838 Other muscle spasm: Secondary | ICD-10-CM | POA: Diagnosis not present

## 2022-01-19 DIAGNOSIS — M9901 Segmental and somatic dysfunction of cervical region: Secondary | ICD-10-CM | POA: Diagnosis not present

## 2022-01-19 DIAGNOSIS — M50322 Other cervical disc degeneration at C5-C6 level: Secondary | ICD-10-CM | POA: Diagnosis not present

## 2022-01-19 DIAGNOSIS — M5412 Radiculopathy, cervical region: Secondary | ICD-10-CM | POA: Diagnosis not present

## 2022-01-20 DIAGNOSIS — M50322 Other cervical disc degeneration at C5-C6 level: Secondary | ICD-10-CM | POA: Diagnosis not present

## 2022-01-20 DIAGNOSIS — M9901 Segmental and somatic dysfunction of cervical region: Secondary | ICD-10-CM | POA: Diagnosis not present

## 2022-01-20 DIAGNOSIS — M62838 Other muscle spasm: Secondary | ICD-10-CM | POA: Diagnosis not present

## 2022-01-20 DIAGNOSIS — M5412 Radiculopathy, cervical region: Secondary | ICD-10-CM | POA: Diagnosis not present

## 2022-01-24 DIAGNOSIS — M9901 Segmental and somatic dysfunction of cervical region: Secondary | ICD-10-CM | POA: Diagnosis not present

## 2022-01-24 DIAGNOSIS — M5412 Radiculopathy, cervical region: Secondary | ICD-10-CM | POA: Diagnosis not present

## 2022-01-24 DIAGNOSIS — M50322 Other cervical disc degeneration at C5-C6 level: Secondary | ICD-10-CM | POA: Diagnosis not present

## 2022-01-24 DIAGNOSIS — M62838 Other muscle spasm: Secondary | ICD-10-CM | POA: Diagnosis not present

## 2022-01-25 DIAGNOSIS — M50322 Other cervical disc degeneration at C5-C6 level: Secondary | ICD-10-CM | POA: Diagnosis not present

## 2022-01-25 DIAGNOSIS — M9901 Segmental and somatic dysfunction of cervical region: Secondary | ICD-10-CM | POA: Diagnosis not present

## 2022-01-25 DIAGNOSIS — M5412 Radiculopathy, cervical region: Secondary | ICD-10-CM | POA: Diagnosis not present

## 2022-01-25 DIAGNOSIS — M62838 Other muscle spasm: Secondary | ICD-10-CM | POA: Diagnosis not present

## 2022-01-26 ENCOUNTER — Encounter: Payer: Self-pay | Admitting: Family Medicine

## 2022-01-26 ENCOUNTER — Ambulatory Visit (INDEPENDENT_AMBULATORY_CARE_PROVIDER_SITE_OTHER): Payer: BC Managed Care – PPO | Admitting: Family Medicine

## 2022-01-26 VITALS — BP 110/60 | HR 64 | Ht 62.0 in | Wt 222.4 lb

## 2022-01-26 DIAGNOSIS — Z0000 Encounter for general adult medical examination without abnormal findings: Secondary | ICD-10-CM | POA: Diagnosis not present

## 2022-01-26 NOTE — Assessment & Plan Note (Signed)
Reviewed her previous LDL and calculated ASCVD risk.  She is a little concerned because her father has cardiovascular disease.  She is a former smoker but stopped 20 years ago.  We declined to get another LDL today, will check in 5 years.  We discussed possibly starting statin on her if she wants to maximize benefit and she defers at this time.  Lifetime ASCVD risk is 39%.  5-year risk is 0.4%

## 2022-01-26 NOTE — Progress Notes (Signed)
    CHIEF COMPLAINT / HPI: He is a well adult checkup.  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  Overall doing pretty well.  Likes the medication she is on.  Only issues are some stressors as both her parents have had medical issues in the last few weeks to months.  Otherwise she is doing fairly well.  She just had an endometrial ablation and there is recovering from that.  She sees OB/GYN.   PERTINENT  PMH / PSH: I have reviewed the patient's medications, allergies, past medical and surgical history, smoking status and updated in the EMR as appropriate. Endometrial ablation Novasure with polypectomy (benign) for menorrhagia  OBJECTIVE:  BP 110/60   Pulse 64   Ht 5\' 2"  (1.575 m)   Wt 222 lb 6.4 oz (100.9 kg)   LMP 01/25/2022   SpO2 98%   BMI 40.68 kg/m  Vital signs reviewed. GENERAL: Well-developed, well-nourished, no acute distress. CARDIOVASCULAR: Regular rate and rhythm no murmur gallop or rub LUNGS: Clear to auscultation bilaterally, no rales or wheeze. ABDOMEN: Soft positive bowel sounds NEURO: No gross focal neurological deficits. MSK: Movement of extremity x 4. PSYCH: AxOx4. Good eye contact.. No psychomotor retardation or agitation. Appropriate speech fluency and content. Asks and answers questions appropriately. Mood is congruent.   ASSESSMENT / PLAN:   Well adult health check Reviewed her previous LDL and calculated ASCVD risk.  She is a little concerned because her father has cardiovascular disease.  She is a former smoker but stopped 20 years ago.  We declined to get another LDL today, will check in 5 years.  We discussed possibly starting statin on her if she wants to maximize benefit and she defers at this time.  Lifetime ASCVD risk is 39%.  5-year risk is 0.4%   13/09/2021 MD

## 2022-01-27 DIAGNOSIS — M50322 Other cervical disc degeneration at C5-C6 level: Secondary | ICD-10-CM | POA: Diagnosis not present

## 2022-01-27 DIAGNOSIS — M9901 Segmental and somatic dysfunction of cervical region: Secondary | ICD-10-CM | POA: Diagnosis not present

## 2022-01-27 DIAGNOSIS — M5412 Radiculopathy, cervical region: Secondary | ICD-10-CM | POA: Diagnosis not present

## 2022-01-27 DIAGNOSIS — M62838 Other muscle spasm: Secondary | ICD-10-CM | POA: Diagnosis not present

## 2022-01-31 DIAGNOSIS — M5412 Radiculopathy, cervical region: Secondary | ICD-10-CM | POA: Diagnosis not present

## 2022-01-31 DIAGNOSIS — M50322 Other cervical disc degeneration at C5-C6 level: Secondary | ICD-10-CM | POA: Diagnosis not present

## 2022-01-31 DIAGNOSIS — M9901 Segmental and somatic dysfunction of cervical region: Secondary | ICD-10-CM | POA: Diagnosis not present

## 2022-01-31 DIAGNOSIS — M62838 Other muscle spasm: Secondary | ICD-10-CM | POA: Diagnosis not present

## 2022-02-01 DIAGNOSIS — M5412 Radiculopathy, cervical region: Secondary | ICD-10-CM | POA: Diagnosis not present

## 2022-02-01 DIAGNOSIS — M50322 Other cervical disc degeneration at C5-C6 level: Secondary | ICD-10-CM | POA: Diagnosis not present

## 2022-02-01 DIAGNOSIS — M9901 Segmental and somatic dysfunction of cervical region: Secondary | ICD-10-CM | POA: Diagnosis not present

## 2022-02-01 DIAGNOSIS — M62838 Other muscle spasm: Secondary | ICD-10-CM | POA: Diagnosis not present

## 2022-02-03 DIAGNOSIS — M62838 Other muscle spasm: Secondary | ICD-10-CM | POA: Diagnosis not present

## 2022-02-03 DIAGNOSIS — M9901 Segmental and somatic dysfunction of cervical region: Secondary | ICD-10-CM | POA: Diagnosis not present

## 2022-02-03 DIAGNOSIS — M50322 Other cervical disc degeneration at C5-C6 level: Secondary | ICD-10-CM | POA: Diagnosis not present

## 2022-02-03 DIAGNOSIS — M5412 Radiculopathy, cervical region: Secondary | ICD-10-CM | POA: Diagnosis not present

## 2022-02-07 DIAGNOSIS — M50322 Other cervical disc degeneration at C5-C6 level: Secondary | ICD-10-CM | POA: Diagnosis not present

## 2022-02-07 DIAGNOSIS — M62838 Other muscle spasm: Secondary | ICD-10-CM | POA: Diagnosis not present

## 2022-02-07 DIAGNOSIS — M9901 Segmental and somatic dysfunction of cervical region: Secondary | ICD-10-CM | POA: Diagnosis not present

## 2022-02-07 DIAGNOSIS — M5412 Radiculopathy, cervical region: Secondary | ICD-10-CM | POA: Diagnosis not present

## 2022-02-08 DIAGNOSIS — M9901 Segmental and somatic dysfunction of cervical region: Secondary | ICD-10-CM | POA: Diagnosis not present

## 2022-02-08 DIAGNOSIS — M5412 Radiculopathy, cervical region: Secondary | ICD-10-CM | POA: Diagnosis not present

## 2022-02-08 DIAGNOSIS — M62838 Other muscle spasm: Secondary | ICD-10-CM | POA: Diagnosis not present

## 2022-02-08 DIAGNOSIS — M50322 Other cervical disc degeneration at C5-C6 level: Secondary | ICD-10-CM | POA: Diagnosis not present

## 2022-02-09 DIAGNOSIS — M5412 Radiculopathy, cervical region: Secondary | ICD-10-CM | POA: Diagnosis not present

## 2022-02-09 DIAGNOSIS — M9901 Segmental and somatic dysfunction of cervical region: Secondary | ICD-10-CM | POA: Diagnosis not present

## 2022-02-09 DIAGNOSIS — M50322 Other cervical disc degeneration at C5-C6 level: Secondary | ICD-10-CM | POA: Diagnosis not present

## 2022-02-09 DIAGNOSIS — M62838 Other muscle spasm: Secondary | ICD-10-CM | POA: Diagnosis not present

## 2022-02-14 DIAGNOSIS — M50322 Other cervical disc degeneration at C5-C6 level: Secondary | ICD-10-CM | POA: Diagnosis not present

## 2022-02-14 DIAGNOSIS — M62838 Other muscle spasm: Secondary | ICD-10-CM | POA: Diagnosis not present

## 2022-02-14 DIAGNOSIS — M9901 Segmental and somatic dysfunction of cervical region: Secondary | ICD-10-CM | POA: Diagnosis not present

## 2022-02-14 DIAGNOSIS — M5412 Radiculopathy, cervical region: Secondary | ICD-10-CM | POA: Diagnosis not present

## 2022-02-17 DIAGNOSIS — M9901 Segmental and somatic dysfunction of cervical region: Secondary | ICD-10-CM | POA: Diagnosis not present

## 2022-02-17 DIAGNOSIS — M62838 Other muscle spasm: Secondary | ICD-10-CM | POA: Diagnosis not present

## 2022-02-17 DIAGNOSIS — M5412 Radiculopathy, cervical region: Secondary | ICD-10-CM | POA: Diagnosis not present

## 2022-02-17 DIAGNOSIS — M50322 Other cervical disc degeneration at C5-C6 level: Secondary | ICD-10-CM | POA: Diagnosis not present

## 2022-02-21 DIAGNOSIS — S61211A Laceration without foreign body of left index finger without damage to nail, initial encounter: Secondary | ICD-10-CM | POA: Diagnosis not present

## 2022-02-21 DIAGNOSIS — M50322 Other cervical disc degeneration at C5-C6 level: Secondary | ICD-10-CM | POA: Diagnosis not present

## 2022-02-21 DIAGNOSIS — M62838 Other muscle spasm: Secondary | ICD-10-CM | POA: Diagnosis not present

## 2022-02-21 DIAGNOSIS — M9901 Segmental and somatic dysfunction of cervical region: Secondary | ICD-10-CM | POA: Diagnosis not present

## 2022-02-21 DIAGNOSIS — M5412 Radiculopathy, cervical region: Secondary | ICD-10-CM | POA: Diagnosis not present

## 2022-02-23 DIAGNOSIS — M5412 Radiculopathy, cervical region: Secondary | ICD-10-CM | POA: Diagnosis not present

## 2022-02-23 DIAGNOSIS — M9901 Segmental and somatic dysfunction of cervical region: Secondary | ICD-10-CM | POA: Diagnosis not present

## 2022-02-23 DIAGNOSIS — M50322 Other cervical disc degeneration at C5-C6 level: Secondary | ICD-10-CM | POA: Diagnosis not present

## 2022-02-23 DIAGNOSIS — M62838 Other muscle spasm: Secondary | ICD-10-CM | POA: Diagnosis not present

## 2022-03-01 DIAGNOSIS — M62838 Other muscle spasm: Secondary | ICD-10-CM | POA: Diagnosis not present

## 2022-03-01 DIAGNOSIS — M50322 Other cervical disc degeneration at C5-C6 level: Secondary | ICD-10-CM | POA: Diagnosis not present

## 2022-03-01 DIAGNOSIS — M9901 Segmental and somatic dysfunction of cervical region: Secondary | ICD-10-CM | POA: Diagnosis not present

## 2022-03-01 DIAGNOSIS — M5412 Radiculopathy, cervical region: Secondary | ICD-10-CM | POA: Diagnosis not present

## 2022-03-03 DIAGNOSIS — M50322 Other cervical disc degeneration at C5-C6 level: Secondary | ICD-10-CM | POA: Diagnosis not present

## 2022-03-03 DIAGNOSIS — M62838 Other muscle spasm: Secondary | ICD-10-CM | POA: Diagnosis not present

## 2022-03-03 DIAGNOSIS — M9901 Segmental and somatic dysfunction of cervical region: Secondary | ICD-10-CM | POA: Diagnosis not present

## 2022-03-03 DIAGNOSIS — M5412 Radiculopathy, cervical region: Secondary | ICD-10-CM | POA: Diagnosis not present

## 2022-03-09 DIAGNOSIS — M62838 Other muscle spasm: Secondary | ICD-10-CM | POA: Diagnosis not present

## 2022-03-09 DIAGNOSIS — M50322 Other cervical disc degeneration at C5-C6 level: Secondary | ICD-10-CM | POA: Diagnosis not present

## 2022-03-09 DIAGNOSIS — M5412 Radiculopathy, cervical region: Secondary | ICD-10-CM | POA: Diagnosis not present

## 2022-03-09 DIAGNOSIS — M9901 Segmental and somatic dysfunction of cervical region: Secondary | ICD-10-CM | POA: Diagnosis not present

## 2022-03-11 DIAGNOSIS — R0602 Shortness of breath: Secondary | ICD-10-CM | POA: Diagnosis not present

## 2022-03-11 DIAGNOSIS — Z131 Encounter for screening for diabetes mellitus: Secondary | ICD-10-CM | POA: Diagnosis not present

## 2022-03-11 DIAGNOSIS — Z6841 Body Mass Index (BMI) 40.0 and over, adult: Secondary | ICD-10-CM | POA: Diagnosis not present

## 2022-03-11 DIAGNOSIS — M62838 Other muscle spasm: Secondary | ICD-10-CM | POA: Diagnosis not present

## 2022-03-11 DIAGNOSIS — M9901 Segmental and somatic dysfunction of cervical region: Secondary | ICD-10-CM | POA: Diagnosis not present

## 2022-03-11 DIAGNOSIS — M5412 Radiculopathy, cervical region: Secondary | ICD-10-CM | POA: Diagnosis not present

## 2022-03-11 DIAGNOSIS — Z3200 Encounter for pregnancy test, result unknown: Secondary | ICD-10-CM | POA: Diagnosis not present

## 2022-03-11 DIAGNOSIS — R5383 Other fatigue: Secondary | ICD-10-CM | POA: Diagnosis not present

## 2022-03-11 DIAGNOSIS — R635 Abnormal weight gain: Secondary | ICD-10-CM | POA: Diagnosis not present

## 2022-03-11 DIAGNOSIS — M50322 Other cervical disc degeneration at C5-C6 level: Secondary | ICD-10-CM | POA: Diagnosis not present

## 2022-03-22 DIAGNOSIS — M50322 Other cervical disc degeneration at C5-C6 level: Secondary | ICD-10-CM | POA: Diagnosis not present

## 2022-03-22 DIAGNOSIS — M62838 Other muscle spasm: Secondary | ICD-10-CM | POA: Diagnosis not present

## 2022-03-22 DIAGNOSIS — M9901 Segmental and somatic dysfunction of cervical region: Secondary | ICD-10-CM | POA: Diagnosis not present

## 2022-03-22 DIAGNOSIS — M5412 Radiculopathy, cervical region: Secondary | ICD-10-CM | POA: Diagnosis not present

## 2022-04-14 DIAGNOSIS — Z1231 Encounter for screening mammogram for malignant neoplasm of breast: Secondary | ICD-10-CM | POA: Diagnosis not present

## 2022-04-17 ENCOUNTER — Other Ambulatory Visit: Payer: Self-pay | Admitting: Family Medicine

## 2022-04-19 DIAGNOSIS — M1712 Unilateral primary osteoarthritis, left knee: Secondary | ICD-10-CM | POA: Diagnosis not present

## 2022-04-19 DIAGNOSIS — M25562 Pain in left knee: Secondary | ICD-10-CM | POA: Diagnosis not present

## 2022-04-23 ENCOUNTER — Telehealth: Payer: BC Managed Care – PPO | Admitting: Physician Assistant

## 2022-04-23 DIAGNOSIS — R6889 Other general symptoms and signs: Secondary | ICD-10-CM

## 2022-04-23 MED ORDER — OSELTAMIVIR PHOSPHATE 75 MG PO CAPS: 75.0000 mg | ORAL_CAPSULE | Freq: Two times a day (BID) | ORAL | 0 refills | Status: DC

## 2022-04-23 NOTE — Progress Notes (Signed)

## 2022-05-09 ENCOUNTER — Encounter: Payer: Self-pay | Admitting: Family Medicine

## 2022-05-09 MED ORDER — TRAZODONE HCL 100 MG PO TABS: 100.0000 mg | ORAL_TABLET | Freq: Every day | ORAL | 3 refills | Status: DC

## 2022-05-17 ENCOUNTER — Encounter: Payer: Self-pay | Admitting: Family Medicine

## 2022-05-19 ENCOUNTER — Ambulatory Visit (INDEPENDENT_AMBULATORY_CARE_PROVIDER_SITE_OTHER): Payer: BC Managed Care – PPO | Admitting: Family Medicine

## 2022-05-19 VITALS — BP 134/78 | HR 77 | Wt 227.0 lb

## 2022-05-19 DIAGNOSIS — E039 Hypothyroidism, unspecified: Secondary | ICD-10-CM

## 2022-05-19 DIAGNOSIS — G2581 Restless legs syndrome: Secondary | ICD-10-CM

## 2022-05-19 DIAGNOSIS — Z1159 Encounter for screening for other viral diseases: Secondary | ICD-10-CM

## 2022-05-19 DIAGNOSIS — I10 Essential (primary) hypertension: Secondary | ICD-10-CM

## 2022-05-19 DIAGNOSIS — R450 Nervousness: Secondary | ICD-10-CM | POA: Diagnosis not present

## 2022-05-19 MED ORDER — METOPROLOL SUCCINATE ER 50 MG PO TB24: 50.0000 mg | ORAL_TABLET | Freq: Every day | ORAL | 3 refills | Status: DC

## 2022-05-19 NOTE — Progress Notes (Signed)
    CHIEF COMPLAINT / HPI:   Elevated blood pressure.  1 reading at the dentist office was 167/100.  She has been checking them at home with her blood pressure cuff and typically she had been in the [id number] systolic and now she is in the 130s to 40s.  Diastolic is frequently in the 90s.  She also feels like she is having more headaches than usual and feels a little bit more jittery than usual.  PERTINENT  PMH / PSH: I have reviewed the patient's medications, allergies, past medical and surgical history, smoking status and updated in the EMR as appropriate.   OBJECTIVE:  BP 134/78   Pulse 77   Wt 227 lb (103 kg)   LMP 05/15/2022   SpO2 98%   BMI 41.52 kg/m   GENERAL: Well-developed female no acute distress CV: Regular rate and rhythm ASSESSMENT / PLAN:   Primary hypertension Reviewed her readings.  She has quite a few readings over multiple days.  She is clearly above 140/90 many times.  Will start Toprol all as that may help her with her anxiety.  Check some labs today including thyroid.  She will bring her blood pressure cuff to a nurse visit in the next week or so to check both that and her pressure.  I will follow-up with her regarding her labs.   Dorcas Mcmurray MD

## 2022-05-19 NOTE — Assessment & Plan Note (Signed)
Reviewed her readings.  She has quite a few readings over multiple days.  She is clearly above 140/90 many times.  Will start Toprol all as that may help her with her anxiety.  Check some labs today including thyroid.  She will bring her blood pressure cuff to a nurse visit in the next week or so to check both that and her pressure.  I will follow-up with her regarding her labs.

## 2022-05-19 NOTE — Patient Instructions (Signed)
I have started you on metoprolol once a day. Let's have you come back with your BP machine and recheck . I will send you a note about your blood work. Great to see you!

## 2022-05-20 ENCOUNTER — Encounter: Payer: Self-pay | Admitting: Family Medicine

## 2022-05-20 LAB — BASIC METABOLIC PANEL
BUN/Creatinine Ratio: 17 (ref 9–23)
BUN: 13 mg/dL (ref 6–24)
CO2: 24 mmol/L (ref 20–29)
Calcium: 9.4 mg/dL (ref 8.7–10.2)
Chloride: 101 mmol/L (ref 96–106)
Creatinine, Ser: 0.76 mg/dL (ref 0.57–1.00)
Glucose: 87 mg/dL (ref 70–99)
Potassium: 4.4 mmol/L (ref 3.5–5.2)
Sodium: 139 mmol/L (ref 134–144)
eGFR: 100 mL/min/{1.73_m2} (ref >59)

## 2022-05-20 LAB — IRON AND TIBC
Iron Saturation: 21 % (ref 15–55)
Iron: 76 ug/dL (ref 27–159)
Total Iron Binding Capacity: 366 ug/dL (ref 250–450)
UIBC: 290 ug/dL (ref 131–425)

## 2022-05-20 LAB — CBC
Hematocrit: 38.4 % (ref 34.0–46.6)
Hemoglobin: 13 g/dL (ref 11.1–15.9)
MCH: 30.8 pg (ref 26.6–33.0)
MCHC: 33.9 g/dL (ref 31.5–35.7)
MCV: 91 fL (ref 79–97)
Platelets: 249 10*3/uL (ref 150–450)
RBC: 4.22 x10E6/uL (ref 3.77–5.28)
RDW: 12.2 % (ref 11.7–15.4)
WBC: 7.6 10*3/uL (ref 3.4–10.8)

## 2022-05-20 LAB — TSH+FREE T4
Free T4: 1.07 ng/dL (ref 0.82–1.77)
TSH: 2.33 u[IU]/mL (ref 0.450–4.500)

## 2022-05-20 LAB — HEPATITIS C ANTIBODY: Hep C Virus Ab: NONREACTIVE

## 2022-05-20 LAB — FERRITIN: Ferritin: 50 ng/mL (ref 15–150)

## 2022-05-22 ENCOUNTER — Other Ambulatory Visit: Payer: Self-pay | Admitting: Family Medicine

## 2022-05-25 ENCOUNTER — Telehealth: Payer: BC Managed Care – PPO | Admitting: Physician Assistant

## 2022-05-25 DIAGNOSIS — R3989 Other symptoms and signs involving the genitourinary system: Secondary | ICD-10-CM | POA: Diagnosis not present

## 2022-05-25 MED ORDER — CEPHALEXIN 500 MG PO CAPS: 500.0000 mg | ORAL_CAPSULE | Freq: Two times a day (BID) | ORAL | 0 refills | Status: DC

## 2022-05-25 NOTE — Patient Instructions (Signed)
Kara Peters, thank you for joining Mar Daring, PA-C for today's virtual visit.  While this provider is not your primary care provider (PCP), if your PCP is located in our provider database this encounter information will be shared with them immediately following your visit.   [facility] account gives you access to today's visit and all your visits, tests, and labs performed at [facility] " click here if you don't have a [facility] account or go to [email or web address]  Consent: (Patient) Kara Peters provided verbal consent for this virtual visit at the beginning of the encounter.  Current Medications:  Current Outpatient Medications:    cephALEXin (KEFLEX) 500 MG capsule, Take 1 capsule (500 mg total) by mouth 2 (two) times daily., Disp: 14 capsule, Rfl: 0   cetirizine (ZYRTEC) 10 MG tablet, Take 20 mg by mouth daily., Disp: , Rfl:    Cholecalciferol (D3-1000) 25 MCG (1000 UT) tablet, Take 1,000 Units by mouth daily., Disp: , Rfl:    FLUoxetine (PROZAC) 20 MG capsule, Take 1 capsule (20 mg total) by mouth daily., Disp: 90 capsule, Rfl: 3   magnesium gluconate (MAGONATE) 500 MG tablet, Take 500 mg by mouth daily., Disp: , Rfl:    metoprolol succinate (TOPROL-XL) 50 MG 24 hr tablet, Take 1 tablet (50 mg total) by mouth at bedtime. Take with or immediately following a meal., Disp: 90 tablet, Rfl: 3   Multiple Vitamin (MULTIVITAMIN WITH MINERALS) TABS tablet, Take 1 tablet by mouth daily., Disp: , Rfl:    rOPINIRole (REQUIP) 1 MG tablet, TAKE 1 TABLET BY MOUTH AT BEDTIME, Disp: 90 tablet, Rfl: 3   traZODone (DESYREL) 100 MG tablet, Take 1 tablet (100 mg total) by mouth at bedtime., Disp: 90 tablet, Rfl: 3   Medications ordered in this encounter:  Meds ordered this encounter  Medications   cephALEXin (KEFLEX) 500 MG capsule    Sig: Take 1 capsule (500 mg total) by mouth 2 (two) times daily.    Dispense:  14 capsule    Refill:  0    Order  Specific Question:   Supervising Provider    Answer:   Chase Picket [id number]     *If you need refills on other medications prior to your next appointment, please contact your pharmacy*  Follow-Up: Call back or seek an in-person evaluation if the symptoms worsen or if the condition fails to improve as anticipated.  [place] [phone]  Other Instructions  Urinary Tract Infection, Adult A urinary tract infection (UTI) is an infection of any part of the urinary tract. The urinary tract includes: The kidneys. The ureters. The bladder. The urethra. These organs make, store, and get rid of pee (urine) in the body. What are the causes? This infection is caused by germs (bacteria) in your genital area. These germs grow and cause swelling (inflammation) of your urinary tract. What increases the risk? The following factors may make you more likely to develop this condition: Using a small, thin tube (catheter) to drain pee. Not being able to control when you pee or poop (incontinence). Being female. If you are female, these things can increase the risk: Using these methods to prevent pregnancy: A medicine that kills sperm (spermicide). A device that blocks sperm (diaphragm). Having low levels of a female hormone (estrogen). Being pregnant. You are more likely to develop this condition if: You have genes that add to your risk. You are sexually active. You take antibiotic medicines.  You have trouble peeing because of: A prostate that is bigger than normal, if you are female. A blockage in the part of your body that drains pee from the bladder. A kidney stone. A nerve condition that affects your bladder. Not getting enough to drink. Not peeing often enough. You have other conditions, such as: Diabetes. A weak disease-fighting system (immune system). Sickle cell disease. Gout. Injury of the spine. What are the signs or symptoms? Symptoms of this condition  include: Needing to pee right away. Peeing small amounts often. Pain or burning when peeing. Blood in the pee. Pee that smells bad or not like normal. Trouble peeing. Pee that is cloudy. Fluid coming from the vagina, if you are female. Pain in the belly or lower back. Other symptoms include: Vomiting. Not feeling hungry. Feeling mixed up (confused). This may be the first symptom in older adults. Being tired and grouchy (irritable). A fever. Watery poop (diarrhea). How is this treated? Taking antibiotic medicine. Taking other medicines. Drinking enough water. In some cases, you may need to see a specialist. Follow these instructions at home:  Medicines Take over-the-counter and prescription medicines only as told by your doctor. If you were prescribed an antibiotic medicine, take it as told by your doctor. Do not stop taking it even if you start to feel better. General instructions Make sure you: Pee until your bladder is empty. Do not hold pee for a long time. Empty your bladder after sex. Wipe from front to back after peeing or pooping if you are a female. Use each tissue one time when you wipe. Drink enough fluid to keep your pee pale yellow. Keep all follow-up visits. Contact a doctor if: You do not get better after 1-2 days. Your symptoms go away and then come back. Get help right away if: You have very bad back pain. You have very bad pain in your lower belly. You have a fever. You have chills. You feeling like you will vomit or you vomit. Summary A urinary tract infection (UTI) is an infection of any part of the urinary tract. This condition is caused by germs in your genital area. There are many risk factors for a UTI. Treatment includes antibiotic medicines. Drink enough fluid to keep your pee pale yellow. This information is not intended to replace advice given to you by your health care provider. Make sure you discuss any questions you have with your health  care provider. Document Revised: 10/18/2019 Document Reviewed: 10/18/2019 Elsevier Patient Education  [place].    If you have been instructed to have an in-person evaluation today at a local Urgent Care facility, please use the link below. It will take you to a list of all of our available [facility], including address, phone number and hours of operation. Please do not delay care.  [facility]  If you or a family member do not have a primary care provider, use the link below to schedule a visit and establish care. When you choose a [facility] primary care physician or advanced practice provider, you gain a long-term partner in health. Find a Primary Care Provider  Learn more about [facility] in-office and virtual care options: [facility]

## 2022-05-25 NOTE — Progress Notes (Signed)
Virtual Visit Consent   Kara Peters, you are scheduled for a virtual visit with a [facility] provider today. Just as with appointments in the office, your consent must be obtained to participate. Your consent will be active for this visit and any virtual visit you may have with one of our providers in the next 365 days. If you have a MyChart account, a copy of this consent can be sent to you electronically.  As this is a virtual visit, video technology does not allow for your provider to perform a traditional examination. This may limit your provider's ability to fully assess your condition. If your provider identifies any concerns that need to be evaluated in person or the need to arrange testing (such as labs, EKG, etc.), we will make arrangements to do so. Although advances in technology are sophisticated, we cannot ensure that it will always work on either your end or our end. If the connection with a video visit is poor, the visit may have to be switched to a telephone visit. With either a video or telephone visit, we are not always able to ensure that we have a secure connection.  By engaging in this virtual visit, you consent to the provision of healthcare and authorize for your insurance to be billed (if applicable) for the services provided during this visit. Depending on your insurance coverage, you may receive a charge related to this service.  I need to obtain your verbal consent now. Are you willing to proceed with your visit today? Kara Peters has provided verbal consent on 05/25/2022 for a virtual visit (video or telephone). Mar Daring, PA-C  Date: 05/25/2022 2:45 PM  Virtual Visit via Video Note   I, Mar Daring, connected with  Kara Peters  ([id number], 06/30/1980) on 05/25/22 at  2:45 PM EST by a video-enabled telemedicine application and verified that I am speaking with the correct person using two identifiers.  Location: Patient: Virtual Visit Location  Patient: Home Provider: Virtual Visit Location Provider: Home Office   I discussed the limitations of evaluation and management by telemedicine and the availability of in person appointments. The patient expressed understanding and agreed to proceed.    History of Present Illness: Kara Peters is a 42 y.o. who identifies as a female who was assigned female at birth, and is being seen today for possible UTI.  HPI: Urinary Tract Infection  This is a new problem. The current episode started in the past 7 days (couple days ago). The problem occurs every urination. The problem has been gradually worsening. The quality of the pain is described as aching and burning (slight). The pain is mild. There has been no fever. 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 Pertinent negatives include no discharge, hematuria, nausea or vomiting. Associated symptoms comments: Odor to urine. She has tried nothing for the symptoms. The treatment provided no relief.     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   Former smoker 03/19/2021   Restless legs 03/19/2021   History of bilateral carpal tunnel release 03/04/2008    Allergies:  Allergies  Allergen Reactions   Nsaids Other (See Comments)  GASTRIC SLEEVE-HIGH RISK GI BLEED   Medications:  Current Outpatient Medications:    cephALEXin (KEFLEX) 500 MG capsule, Take 1 capsule (500 mg total) by mouth 2 (two) times daily., Disp: 14 capsule, Rfl: 0   cetirizine (ZYRTEC) 10 MG tablet, Take 20 mg by mouth daily., Disp: , Rfl:    Cholecalciferol (D3-1000) 25 MCG (1000 UT) tablet, Take 1,000 Units by mouth daily., Disp: ,  Rfl:    FLUoxetine (PROZAC) 20 MG capsule, Take 1 capsule (20 mg total) by mouth daily., Disp: 90 capsule, Rfl: 3   magnesium gluconate (MAGONATE) 500 MG tablet, Take 500 mg by mouth daily., Disp: , Rfl:    metoprolol succinate (TOPROL-XL) 50 MG 24 hr tablet, Take 1 tablet (50 mg total) by mouth at bedtime. Take with or immediately following a meal., Disp: 90 tablet, Rfl: 3   Multiple Vitamin (MULTIVITAMIN WITH MINERALS) TABS tablet, Take 1 tablet by mouth daily., Disp: , Rfl:    rOPINIRole (REQUIP) 1 MG tablet, TAKE 1 TABLET BY MOUTH AT BEDTIME, Disp: 90 tablet, Rfl: 3   traZODone (DESYREL) 100 MG tablet, Take 1 tablet (100 mg total) by mouth at bedtime., Disp: 90 tablet, Rfl: 3  Observations/Objective: Patient is well-developed, well-nourished in no acute distress.  Resting comfortably at home.  Head is normocephalic, atraumatic.  No labored breathing.  Speech is clear and coherent with logical content.  Patient is alert and oriented at baseline.    Assessment and Plan: 1. Suspected UTI - cephALEXin (KEFLEX) 500 MG capsule; Take 1 capsule (500 mg total) by mouth 2 (two) times daily.  Dispense: 14 capsule; Refill: 0  - Worsening symptoms.  - Will treat empirically with Keflex - May use AZO for bladder spasms - Continue to push fluids.  - Seek in person evaluation for urine culture if symptoms do not improve or if they worsen.    Follow Up Instructions: I discussed the assessment and treatment plan with the patient. The patient was provided an opportunity to ask questions and all were answered. The patient agreed with the plan and demonstrated an understanding of the instructions.  A copy of instructions were sent to the patient via MyChart unless otherwise noted below.    The patient was advised to call back or seek an in-person evaluation if the symptoms worsen or if the condition fails to improve as anticipated.  Time:  I spent 8 minutes with the patient via telehealth  technology discussing the above problems/concerns.    Mar Daring, PA-C

## 2022-06-02 DIAGNOSIS — M25562 Pain in left knee: Secondary | ICD-10-CM | POA: Diagnosis not present

## 2022-06-06 ENCOUNTER — Ambulatory Visit: Payer: BC Managed Care – PPO

## 2022-06-10 ENCOUNTER — Ambulatory Visit: Payer: BC Managed Care – PPO

## 2022-06-12 DIAGNOSIS — M25562 Pain in left knee: Secondary | ICD-10-CM | POA: Diagnosis not present

## 2022-07-01 DIAGNOSIS — M2241 Chondromalacia patellae, right knee: Secondary | ICD-10-CM | POA: Diagnosis not present

## 2022-07-01 DIAGNOSIS — M2242 Chondromalacia patellae, left knee: Secondary | ICD-10-CM | POA: Diagnosis not present

## 2022-07-11 DIAGNOSIS — M25562 Pain in left knee: Secondary | ICD-10-CM | POA: Diagnosis not present

## 2022-07-11 DIAGNOSIS — M25561 Pain in right knee: Secondary | ICD-10-CM | POA: Diagnosis not present

## 2022-07-11 DIAGNOSIS — M545 Low back pain, unspecified: Secondary | ICD-10-CM | POA: Diagnosis not present

## 2022-07-13 ENCOUNTER — Ambulatory Visit: Payer: BC Managed Care – PPO | Admitting: Physical Therapy

## 2022-07-13 DIAGNOSIS — M25561 Pain in right knee: Secondary | ICD-10-CM | POA: Diagnosis not present

## 2022-07-13 DIAGNOSIS — M25562 Pain in left knee: Secondary | ICD-10-CM | POA: Diagnosis not present

## 2022-07-13 DIAGNOSIS — M545 Low back pain, unspecified: Secondary | ICD-10-CM | POA: Diagnosis not present

## 2022-07-18 DIAGNOSIS — Z1151 Encounter for screening for human papillomavirus (HPV): Secondary | ICD-10-CM | POA: Diagnosis not present

## 2022-07-18 DIAGNOSIS — R350 Frequency of micturition: Secondary | ICD-10-CM | POA: Diagnosis not present

## 2022-07-18 DIAGNOSIS — N9089 Other specified noninflammatory disorders of vulva and perineum: Secondary | ICD-10-CM | POA: Diagnosis not present

## 2022-07-18 DIAGNOSIS — Z01419 Encounter for gynecological examination (general) (routine) without abnormal findings: Secondary | ICD-10-CM | POA: Diagnosis not present

## 2022-08-24 ENCOUNTER — Ambulatory Visit (INDEPENDENT_AMBULATORY_CARE_PROVIDER_SITE_OTHER): Payer: BC Managed Care – PPO | Admitting: Family Medicine

## 2022-08-24 ENCOUNTER — Encounter: Payer: Self-pay | Admitting: Family Medicine

## 2022-08-24 VITALS — BP 118/70 | HR 65 | Ht 62.0 in | Wt 233.0 lb

## 2022-08-24 DIAGNOSIS — G2581 Restless legs syndrome: Secondary | ICD-10-CM | POA: Diagnosis not present

## 2022-08-24 DIAGNOSIS — R1013 Epigastric pain: Secondary | ICD-10-CM | POA: Diagnosis not present

## 2022-08-24 DIAGNOSIS — R5383 Other fatigue: Secondary | ICD-10-CM

## 2022-08-24 DIAGNOSIS — F418 Other specified anxiety disorders: Secondary | ICD-10-CM | POA: Diagnosis not present

## 2022-08-24 MED ORDER — GABAPENTIN 100 MG PO CAPS: ORAL_CAPSULE | ORAL | 1 refills | Status: DC

## 2022-08-24 MED ORDER — FLUOXETINE HCL 10 MG PO CAPS: 10.0000 mg | ORAL_CAPSULE | Freq: Every day | ORAL | 3 refills | Status: DC

## 2022-08-24 NOTE — Progress Notes (Signed)
CHIEF COMPLAINT / HPI: Has several issues. 1.  Restless legs seem to be worse.  Initially the Requip seem to help but then it does not seem like it is helping at all now. 2.  Also having return of insomnia.  Had been taking trazodone for a while with benefit but that seems to have stopped working 2. 3.  Stress and anxiety: She has been under some increased stress.  Her daughter and grandson moved back in with her in April and it is just a lot of people in the house.  She is also very active in caring for her parents who have some health issues.  She is also very busy at work and in a part-time job.  Not getting a lot of regular exercise but has started walking at lunch and that seems to help some anxiety.  At night, when she goes to bed the anxiety is the worst.  She thinks is partly because she knows she is not going to sleep well and that she is going to have restless legs.  Her husband has told her that she "dances all throughout the night". 4.  Last Friday had an episode of epigastric pain with burping and nausea associated with some sweating.  No chest pain.  No shortness of breath.  She did not feel like eating much that night but by the next day she was back to normal.  Has not had any recurrent episodes. 5.  Had about a week where she was feeling extremely tired in the afternoon.  She wondered if this was related to blood pressure but was unable to check her blood pressure during this time.  Since then she has not had recurrence of that she is continued on her regular blood pressure medicine without problem.   PERTINENT  PMH / PSH: I have reviewed the patient's medications, allergies, past medical and surgical history, smoking status and updated in the EMR as appropriate. Had iron profile done as well as other lab work in February and it was relatively normal.  OBJECTIVE:  BP 118/70   Pulse 65   Ht 5\' 2"  (1.575 m)   Wt 233 lb (105.7 kg)   LMP 07/26/2022   SpO2 98%   BMI 42.62 kg/m   GENERAL: Well-developed female no acute distress PSYCH: AxOx4. Good eye contact.. No psychomotor retardation or agitation. Appropriate speech fluency and content. Asks and answers questions appropriately. Mood is congruent.   ASSESSMENT / PLAN: #3.  Episode of epigastric discomfort and nausea that has since resolved.  Has not been recurrent.  I do not think this is related to cardiac issue.  Since she has had no other recurrence of symptoms will follow for now but she will let me know if she has recurrence of issues.  Would consider cardiac workup where she did have recurrent symptoms.  Also might consider gallbladder ultrasound. 4.  1 week of midday fatigue.  Since she did not have any blood pressure readings for these, I cannot say that it was related to hypotension.  Could easily have been hypovolemia.  Since it is resolved, we will just follow-up.  Restless legs Will discontinue Requip.  Start her on taper up of gabapentin.  We discussed how this may take several weeks to get it up to a dose where this works.  I think the gabapentin may help restless legs as well as potentially helping her sleep.  Anxious depression Will increase Loxitane to 30 mg a day.  We have tried that once before and she felt a little blunted but I think it would help her sleep as well as her nighttime anxiety.  I will see her back in 1 month.   Denny Levy MD

## 2022-08-24 NOTE — Patient Instructions (Signed)
Start gabapentin at 100 mg qhs Taper up Take by tablet by mouth Day 1-3:   take one at night  Day 4-6:  Take 2 at night Day 7-10:  take 3 at night Day 11-14:  continue increasing until you hit 600 mg I have sent in an increase in the fluoxetine  Let me see you in about a month

## 2022-08-24 NOTE — Assessment & Plan Note (Signed)
Will discontinue Requip.  Start her on taper up of gabapentin.  We discussed how this may take several weeks to get it up to a dose where this works.  I think the gabapentin may help restless legs as well as potentially helping her sleep.

## 2022-08-24 NOTE — Assessment & Plan Note (Signed)
Will increase Loxitane to 30 mg a day.  We have tried that once before and she felt a little blunted but I think it would help her sleep as well as her nighttime anxiety.  I will see her back in 1 month.

## 2022-08-30 DIAGNOSIS — M542 Cervicalgia: Secondary | ICD-10-CM | POA: Diagnosis not present

## 2022-09-08 DIAGNOSIS — M542 Cervicalgia: Secondary | ICD-10-CM | POA: Diagnosis not present

## 2022-09-14 DIAGNOSIS — M542 Cervicalgia: Secondary | ICD-10-CM | POA: Diagnosis not present

## 2022-09-21 DIAGNOSIS — M542 Cervicalgia: Secondary | ICD-10-CM | POA: Diagnosis not present

## 2022-09-28 ENCOUNTER — Encounter: Payer: Self-pay | Admitting: Family Medicine

## 2022-09-28 ENCOUNTER — Telehealth (INDEPENDENT_AMBULATORY_CARE_PROVIDER_SITE_OTHER): Payer: BC Managed Care – PPO | Admitting: Family Medicine

## 2022-09-28 DIAGNOSIS — G2581 Restless legs syndrome: Secondary | ICD-10-CM | POA: Diagnosis not present

## 2022-09-28 DIAGNOSIS — F418 Other specified anxiety disorders: Secondary | ICD-10-CM | POA: Diagnosis not present

## 2022-09-28 MED ORDER — GABAPENTIN 100 MG PO CAPS: ORAL_CAPSULE | ORAL | 1 refills | Status: DC

## 2022-09-28 MED ORDER — GABAPENTIN 600 MG PO TABS: ORAL_TABLET | ORAL | 1 refills | Status: DC

## 2022-09-28 NOTE — Assessment & Plan Note (Signed)
Some improvement in symptoms with increase in fluoxetine dose.  Will continue at this dose for the time being.  If symptoms start to increase again, she will let me know otherwise we will stay at this dose until I see her in 3 to 6 months.

## 2022-09-28 NOTE — Assessment & Plan Note (Signed)
Called in 600 mg tabs and 100 mg tabs so she can taper up to a total of 900 mg at night.  She will let me know via MyChart how this is going for her.  We discussed side effects and so far she has had none.  She will follow-up with me in the next 4 to 6 weeks via MyChart or phone.

## 2022-09-28 NOTE — Progress Notes (Signed)
[facility] Telemedicine Visit  Patient consented to have virtual visit and was identified by name and date of birth. Method of visit: Video  Encounter participants: Patient: Kara Peters - located at her daughter's hospital room (daughter had emergent appendectomy last night) Provider: Denny Levy - located at office [facility] Others (if applicable): daughter was present in her room  Chief Complaint: follow up med changes  HPI: She thinks the increase in fluoxetine has been beneficial.  Feels a little bit less anxious.  We also added and tapered up gabapentin for restless legs.  She thinks that is better as well.  Says the true test was asking her husband who says it is significantly better and that she does not thrashes much at night or kick him.  She still not getting great sleep and wonders if we need to go up further on the dose.  She is having no problems with that.  No side effects.   ROS: per HPI  Pertinent PMHx: History of hypertension  Exam:  There were no vitals taken for this visit.  Respiratory: Normal respiratory rate no increased work of breathing. PSYCH: AxOx4. Good eye contact.. No psychomotor retardation or agitation. Appropriate speech fluency and content. Asks and answers questions appropriately. Mood is congruent.   Assessment/Plan:  Anxious depression Some improvement in symptoms with increase in fluoxetine dose.  Will continue at this dose for the time being.  If symptoms start to increase again, she will let me know otherwise we will stay at this dose until I see her in 3 to 6 months.  Restless legs Called in 600 mg tabs and 100 mg tabs so she can taper up to a total of 900 mg at night.  She will let me know via MyChart how this is going for her.  We discussed side effects and so far she has had none.  She will follow-up with me in the next 4 to 6 weeks via MyChart or phone.    Time spent during visit with patient: 22 minutes

## 2022-10-26 ENCOUNTER — Encounter: Payer: Self-pay | Admitting: Family Medicine

## 2022-10-27 MED ORDER — GABAPENTIN 600 MG PO TABS: ORAL_TABLET | ORAL | 3 refills | Status: DC

## 2022-11-11 ENCOUNTER — Other Ambulatory Visit: Payer: Self-pay | Admitting: Family Medicine

## 2022-11-22 ENCOUNTER — Other Ambulatory Visit: Payer: Self-pay | Admitting: Family Medicine

## 2022-12-03 IMAGING — CT CT ABD-PELV W/ CM
2 of 4 series · 17 of 46 positions shown, 19 images · IV contrast (Omnipaque)
Comparison: None.

CLINICAL DATA: Epigastric pain LUQ abdominal pain

EXAM:
CT ABDOMEN AND PELVIS WITH CONTRAST
TECHNIQUE: Multidetector CT imaging of the abdomen and pelvis was performed
using the standard protocol following bolus administration of
intravenous contrast.
CONTRAST:  100mL OMNIPAQUE IOHEXOL 300 MG/ML  SOLN

[Series 2: axial st · axial · 0.76mm/px · z∈[+838,+1228]mm · 14 of 86 slices shown, 16 images]
[im 4/86  soft-tissue]
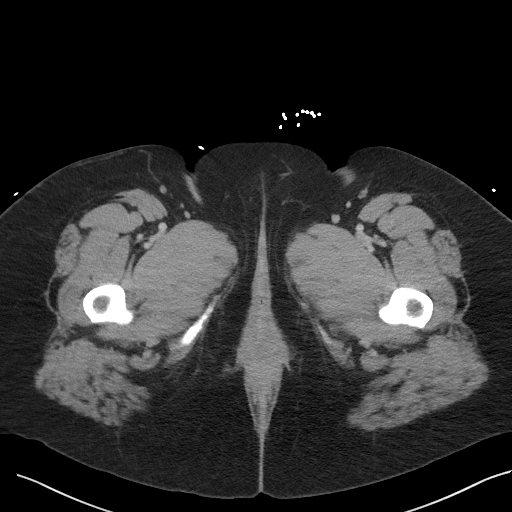
[im 4/86  bone]
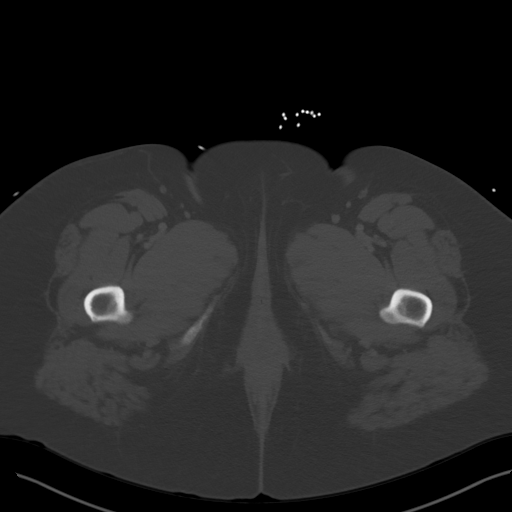
[im 10/86  soft-tissue]
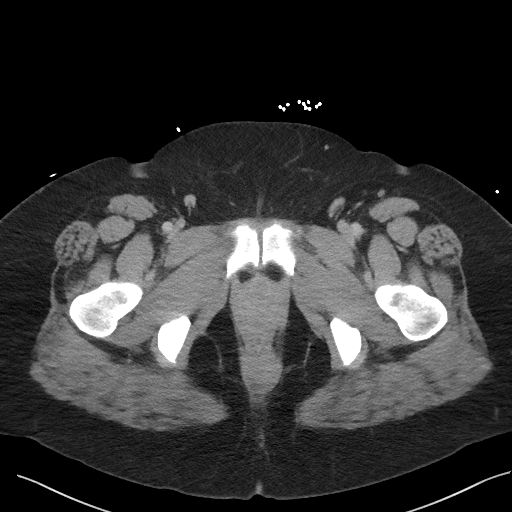
[im 17/86  soft-tissue]
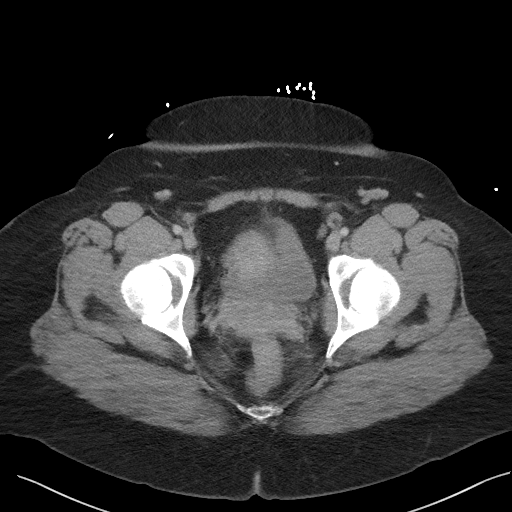
[im 23/86  soft-tissue]
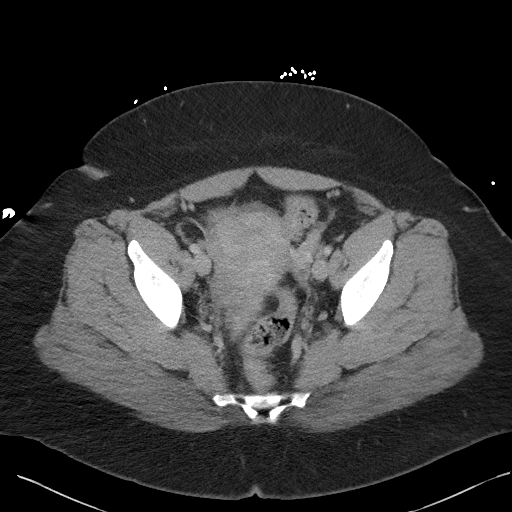
[im 30/86  soft-tissue]
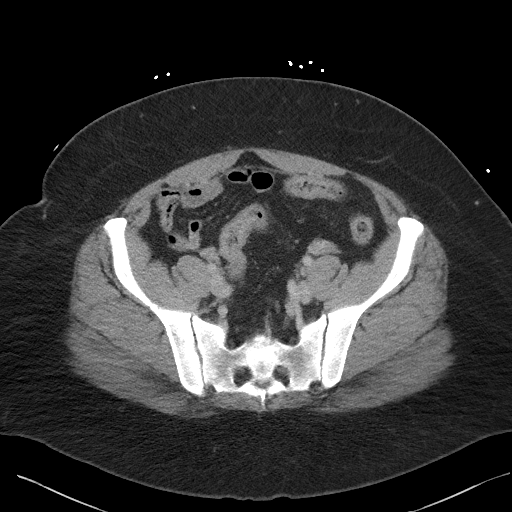
[im 33/86  soft-tissue]
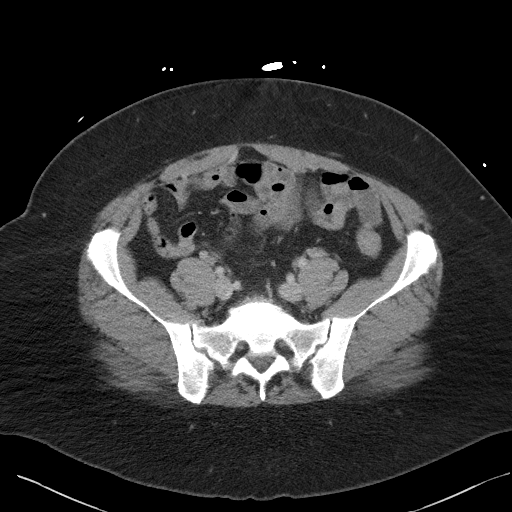
[im 40/86  soft-tissue]
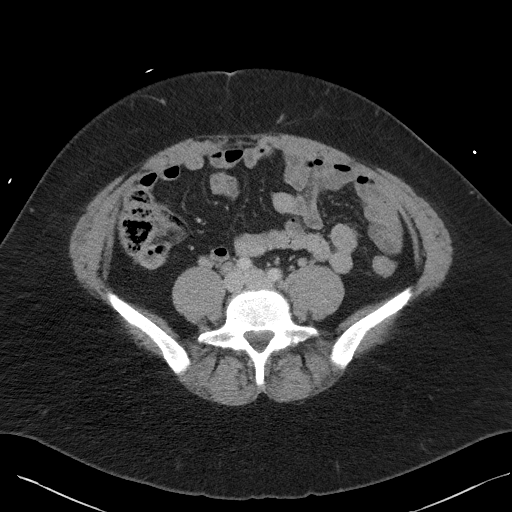
[im 46/86  soft-tissue]
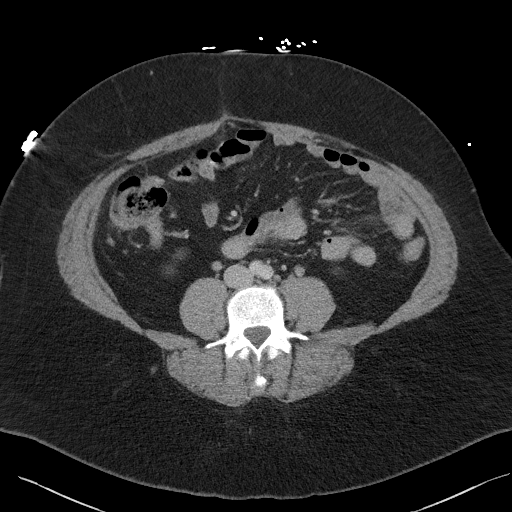
[im 53/86  soft-tissue]
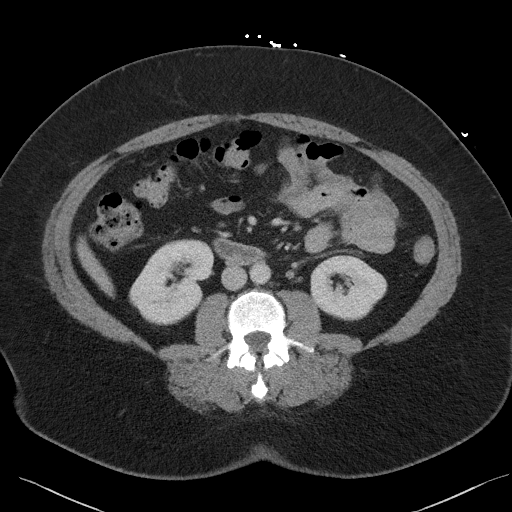
[im 53/86  bone]
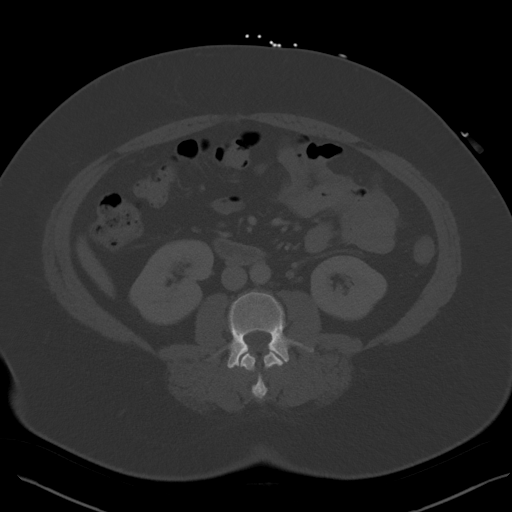
[im 56/86  soft-tissue]
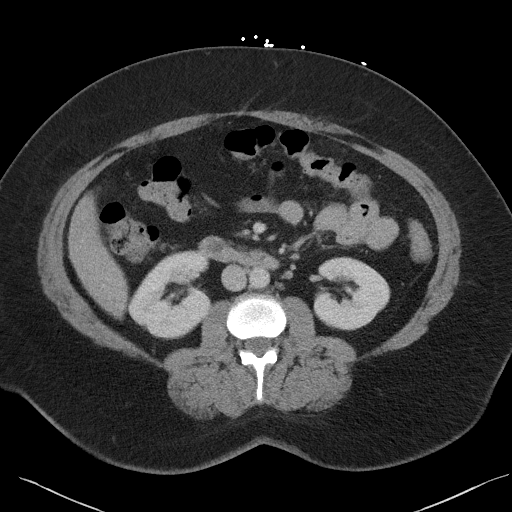
[im 63/86  soft-tissue]
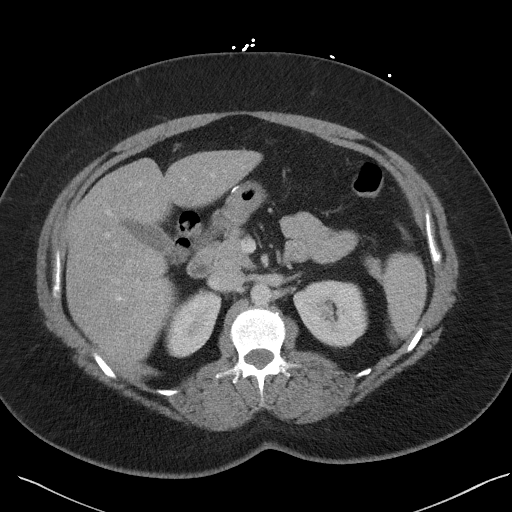
[im 69/86  soft-tissue]
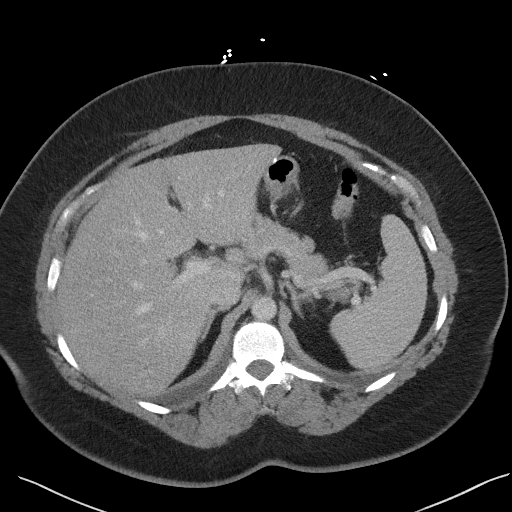
[im 76/86  soft-tissue]
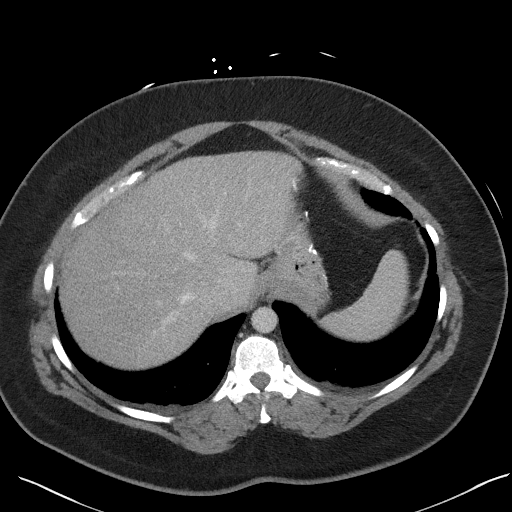
[im 82/86  soft-tissue]
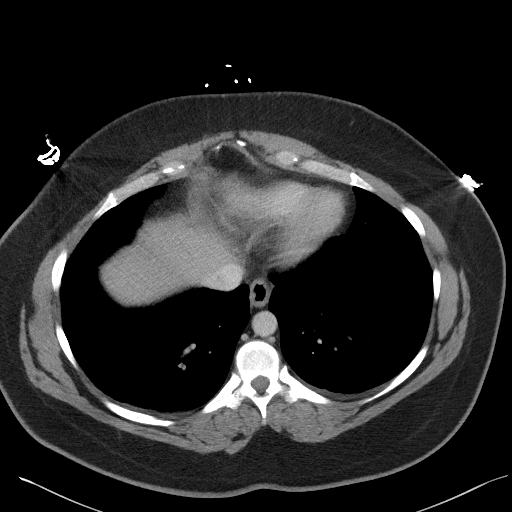

[Series 5: coronal st · coronal · 0.82mm/px · 3 of 101 slices shown]
[im 34/101  soft-tissue]
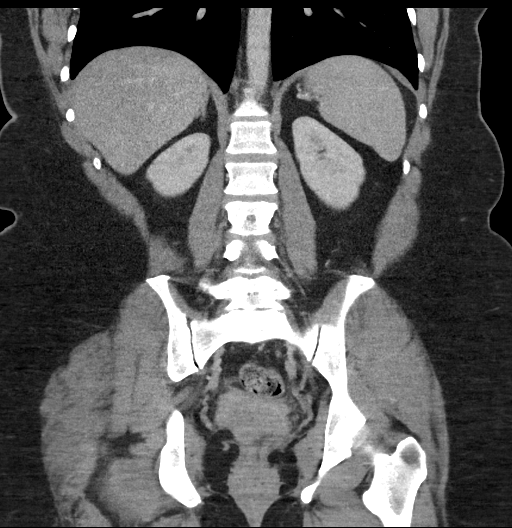
[im 45/101  soft-tissue]
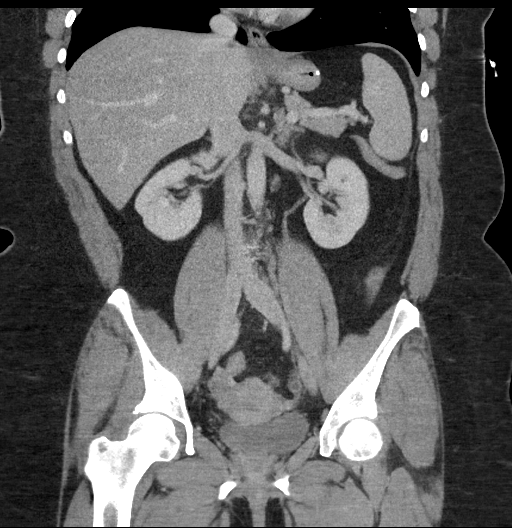
[im 56/101  soft-tissue]
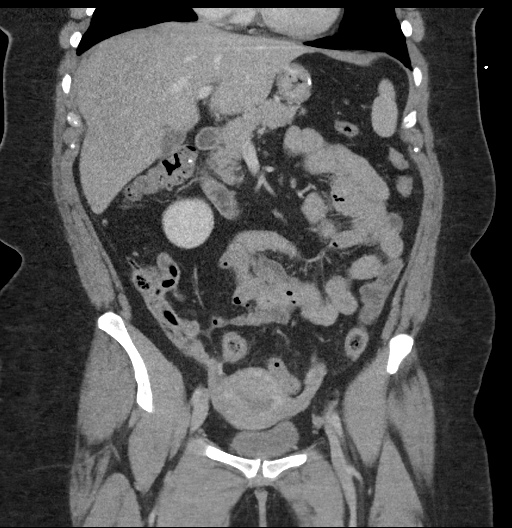

[17 of 46 positions shown; findings below may reference images not displayed]

FINDINGS: Inferior chest: The lung bases are well-aerated.

Hepatobiliary: The liver is normal in size without focal
abnormality. No intrahepatic or extrahepatic biliary ductal
dilation. The gallbladder appears normal.

Spleen: Normal in size without focal abnormality.

Pancreas: No pancreatic ductal dilatation or surrounding
inflammatory changes.

Adrenals/Urinary Tract: Adrenal glands are unremarkable. Kidneys are
normal, without renal calculi, focal lesion, or hydronephrosis.
Bladder is unremarkable.

Stomach/Bowel: Status post sleeve gastrectomy. The stomach, small
bowel and large bowel are normal in caliber without abnormal wall
thickening or surrounding inflammatory changes. The appendix is
normal.

Reproductive: Uterus and bilateral adnexa are unremarkable.

Lymphatic: No enlarged lymph nodes in the abdomen or pelvis.

Vasculature: The abdominal aorta is normal in caliber. The portal
venous system is patent.

Other: No abdominopelvic ascites.

Musculoskeletal: No aggressive osseous lesions. Mild degenerative
changes at L5-S1. The soft tissues are unremarkable.
IMPRESSION: No acute findings in the abdomen or pelvis.

Postsurgical changes of sleeve gastrectomy without complicating
feature.

## 2022-12-22 ENCOUNTER — Encounter: Payer: Self-pay | Admitting: Family Medicine

## 2022-12-29 ENCOUNTER — Other Ambulatory Visit: Payer: Self-pay | Admitting: Family Medicine

## 2022-12-29 MED ORDER — HYDROXYZINE HCL 50 MG PO TABS: 50.0000 mg | ORAL_TABLET | Freq: Every evening | ORAL | 0 refills | Status: DC | PRN

## 2023-01-23 ENCOUNTER — Other Ambulatory Visit: Payer: Self-pay | Admitting: Family Medicine

## 2023-02-01 DIAGNOSIS — F4323 Adjustment disorder with mixed anxiety and depressed mood: Secondary | ICD-10-CM | POA: Diagnosis not present

## 2023-02-08 ENCOUNTER — Encounter: Payer: Self-pay | Admitting: Family Medicine

## 2023-02-08 ENCOUNTER — Ambulatory Visit: Payer: BC Managed Care – PPO | Admitting: Family Medicine

## 2023-02-08 VITALS — BP 110/78 | HR 73 | Ht 62.0 in | Wt 243.2 lb

## 2023-02-08 DIAGNOSIS — I10 Essential (primary) hypertension: Secondary | ICD-10-CM

## 2023-02-08 DIAGNOSIS — R350 Frequency of micturition: Secondary | ICD-10-CM | POA: Diagnosis not present

## 2023-02-08 DIAGNOSIS — G4700 Insomnia, unspecified: Secondary | ICD-10-CM

## 2023-02-08 DIAGNOSIS — E039 Hypothyroidism, unspecified: Secondary | ICD-10-CM | POA: Diagnosis not present

## 2023-02-08 DIAGNOSIS — Z0000 Encounter for general adult medical examination without abnormal findings: Secondary | ICD-10-CM | POA: Diagnosis not present

## 2023-02-08 DIAGNOSIS — F418 Other specified anxiety disorders: Secondary | ICD-10-CM

## 2023-02-09 ENCOUNTER — Encounter: Payer: Self-pay | Admitting: Family Medicine

## 2023-02-09 DIAGNOSIS — G4700 Insomnia, unspecified: Secondary | ICD-10-CM | POA: Insufficient documentation

## 2023-02-09 LAB — MICROSCOPIC EXAMINATION
Bacteria, UA: NONE SEEN
Casts: NONE SEEN /[LPF]
RBC, Urine: NONE SEEN /[HPF] (ref 0–2)
WBC, UA: NONE SEEN /[HPF] (ref 0–5)

## 2023-02-09 LAB — COMPREHENSIVE METABOLIC PANEL
ALT: 25 [IU]/L (ref 0–32)
AST: 19 [IU]/L (ref 0–40)
Albumin: 4.4 g/dL (ref 3.9–4.9)
Alkaline Phosphatase: 82 [IU]/L (ref 44–121)
BUN/Creatinine Ratio: 18 (ref 9–23)
BUN: 14 mg/dL (ref 6–24)
Bilirubin Total: 0.3 mg/dL (ref 0.0–1.2)
CO2: 26 mmol/L (ref 20–29)
Calcium: 9.4 mg/dL (ref 8.7–10.2)
Chloride: 99 mmol/L (ref 96–106)
Creatinine, Ser: 0.8 mg/dL (ref 0.57–1.00)
Globulin, Total: 2.3 g/dL (ref 1.5–4.5)
Glucose: 89 mg/dL (ref 70–99)
Potassium: 4.5 mmol/L (ref 3.5–5.2)
Sodium: 134 mmol/L (ref 134–144)
Total Protein: 6.7 g/dL (ref 6.0–8.5)
eGFR: 94 mL/min/{1.73_m2} (ref >59)

## 2023-02-09 LAB — UA/M W/RFLX CULTURE, ROUTINE
Bilirubin, UA: NEGATIVE
Glucose, UA: NEGATIVE
Ketones, UA: NEGATIVE
Leukocytes,UA: NEGATIVE
Nitrite, UA: NEGATIVE
Protein,UA: NEGATIVE
RBC, UA: NEGATIVE
Specific Gravity, UA: 1.018 (ref 1.005–1.030)
Urobilinogen, Ur: 0.2 mg/dL (ref 0.2–1.0)
pH, UA: 7 (ref 5.0–7.5)

## 2023-02-09 LAB — TSH: TSH: 2.15 u[IU]/mL (ref 0.450–4.500)

## 2023-02-09 NOTE — Progress Notes (Signed)
    CHIEF COMPLAINT / HPI: Here for well adult checkup.  Only has a couple of issues she would like to look at 2.  Feeling a little discomfort when she urinates.  Not true pain or burning and not having any urinary frequency but feels like she might be getting a UTI.  Does not get them often.  No abdominal pain and no fever 3.  Still having trouble sleeping at night.  We had tried Atarax and it helped for quite a while but now it seems to be less helpful.  Wonders if she could increase dose. #4.  Main stressors for her is her parents whose health is declining.  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   PERTINENT  PMH / PSH: I have reviewed the patient's medications, allergies, past medical and surgical history, smoking status and updated in the EMR as appropriate.   OBJECTIVE:  BP 110/78   Pulse 73   Ht 5\' 2"  (1.575 m)   Wt 243 lb 3.2 oz (110.3 kg)   SpO2 99%   BMI 44.48 kg/m  Vital signs reviewed. GENERAL: Well-developed, well-nourished, no acute distress. CARDIOVASCULAR: Regular rate and rhythm no murmur gallop or rub LUNGS: Clear to auscultation bilaterally, no rales or wheeze. ABDOMEN: Soft positive bowel sounds NEURO: No gross focal neurological deficits. MSK: Movement of extremity x 4. PSYCH: AxOx4. Good eye contact.. No psychomotor retardation or agitation. Appropriate speech fluency and content. Asks and answers questions appropriately. Mood is congruent.   ASSESSMENT / PLAN:   Well adult health check She seems to be doing well.  Encouraged more regular exercise.  Discussed health maintenance and she does not wish to have flu or COVID shot today.  She is up-to-date on the rest.  Has an outside OB/GYN physician who does her Pap smear and says that is up-to-date as well. She has enrolled in some type of study looking at cholesterol.  Has not heard back from her blood work yet.  They also did an A1c and she  declines that today as it was just done.  She will let me know what the results are when she gets her labs back from them.  Hypothyroidism Check TSH today.  Primary hypertension Good blood pressure control.  She is tolerating the metoprolol XL 50 well and we will continue that.  Check kidney function today.  Anxious depression Stable on fluoxetine and feels it still helping her so we will continue that unchanged.  Insomnia Will try increasing Atarax to 75 mg nightly.  If that does not work after about a week she will increase it to 100 mg nightly.  She will let me know in 2 weeks via MyChart how this is going.  She is not having some success, could consider switching medicines or possibly sleep referral.   Denny Levy MD

## 2023-02-09 NOTE — Assessment & Plan Note (Signed)
Stable on fluoxetine and feels it still helping her so we will continue that unchanged.

## 2023-02-09 NOTE — Assessment & Plan Note (Signed)
Will try increasing Atarax to 75 mg nightly.  If that does not work after about a week she will increase it to 100 mg nightly.  She will let me know in 2 weeks via MyChart how this is going.  She is not having some success, could consider switching medicines or possibly sleep referral.

## 2023-02-09 NOTE — Assessment & Plan Note (Addendum)
She seems to be doing well.  Encouraged more regular exercise.  Discussed health maintenance and she does not wish to have flu or COVID shot today.  She is up-to-date on the rest.  Has an outside OB/GYN physician who does her Pap smear and says that is up-to-date as well. She has enrolled in some type of study looking at cholesterol.  Has not heard back from her blood work yet.  They also did an A1c and she declines that today as it was just done.  She will let me know what the results are when she gets her labs back from them.

## 2023-02-09 NOTE — Assessment & Plan Note (Signed)
Check TSH today

## 2023-02-09 NOTE — Assessment & Plan Note (Signed)
Good blood pressure control.  She is tolerating the metoprolol XL 50 well and we will continue that.  Check kidney function today.

## 2023-02-10 NOTE — Addendum Note (Signed)
Addended byDenny Levy L on: 02/10/2023 08:08 AM   Modules accepted: Orders

## 2023-02-15 ENCOUNTER — Encounter: Payer: Self-pay | Admitting: Family Medicine

## 2023-02-15 LAB — LIPID PANEL
Cholesterol: 270 — AB (ref 0–200)
LDL Cholesterol: 185
Triglycerides: 188 — AB (ref 40–160)

## 2023-02-20 ENCOUNTER — Telehealth: Payer: Self-pay

## 2023-02-20 NOTE — Telephone Encounter (Signed)
Warden/ranger requesting a call back.   They report the patient has transferred prescription to them from [facility].   They need clarification on Fluoxetine dose.   Called patient to verify she is now using [facility], she reports yes. She reports she is taking 30mg  of Fluoxetine 10mg  and 20mg .   She reports [facility] already filled both for her and she received them.   I have updated her pharmacy in epic.

## 2023-02-22 DIAGNOSIS — F4323 Adjustment disorder with mixed anxiety and depressed mood: Secondary | ICD-10-CM | POA: Diagnosis not present

## 2023-03-02 DIAGNOSIS — F411 Generalized anxiety disorder: Secondary | ICD-10-CM | POA: Diagnosis not present

## 2023-03-02 DIAGNOSIS — F4312 Post-traumatic stress disorder, chronic: Secondary | ICD-10-CM | POA: Diagnosis not present

## 2023-03-02 DIAGNOSIS — F330 Major depressive disorder, recurrent, mild: Secondary | ICD-10-CM | POA: Diagnosis not present

## 2023-03-08 DIAGNOSIS — F4312 Post-traumatic stress disorder, chronic: Secondary | ICD-10-CM | POA: Diagnosis not present

## 2023-03-08 DIAGNOSIS — F411 Generalized anxiety disorder: Secondary | ICD-10-CM | POA: Diagnosis not present

## 2023-03-08 DIAGNOSIS — F330 Major depressive disorder, recurrent, mild: Secondary | ICD-10-CM | POA: Diagnosis not present

## 2023-03-13 ENCOUNTER — Encounter: Payer: Self-pay | Admitting: Family Medicine

## 2023-03-21 DIAGNOSIS — Z130 Encounter for screening for diseases of the blood and blood-forming organs and certain disorders involving the immune mechanism: Secondary | ICD-10-CM | POA: Diagnosis not present

## 2023-03-21 DIAGNOSIS — E039 Hypothyroidism, unspecified: Secondary | ICD-10-CM | POA: Diagnosis not present

## 2023-03-21 DIAGNOSIS — Z131 Encounter for screening for diabetes mellitus: Secondary | ICD-10-CM | POA: Diagnosis not present

## 2023-03-21 DIAGNOSIS — N951 Menopausal and female climacteric states: Secondary | ICD-10-CM | POA: Diagnosis not present

## 2023-03-21 DIAGNOSIS — R635 Abnormal weight gain: Secondary | ICD-10-CM | POA: Diagnosis not present

## 2023-03-21 DIAGNOSIS — E7800 Pure hypercholesterolemia, unspecified: Secondary | ICD-10-CM | POA: Diagnosis not present

## 2023-03-21 DIAGNOSIS — E559 Vitamin D deficiency, unspecified: Secondary | ICD-10-CM | POA: Diagnosis not present

## 2023-03-23 DIAGNOSIS — F4312 Post-traumatic stress disorder, chronic: Secondary | ICD-10-CM | POA: Diagnosis not present

## 2023-03-23 DIAGNOSIS — F330 Major depressive disorder, recurrent, mild: Secondary | ICD-10-CM | POA: Diagnosis not present

## 2023-03-23 DIAGNOSIS — F411 Generalized anxiety disorder: Secondary | ICD-10-CM | POA: Diagnosis not present

## 2023-03-28 DIAGNOSIS — N951 Menopausal and female climacteric states: Secondary | ICD-10-CM | POA: Diagnosis not present

## 2023-03-28 DIAGNOSIS — Z6841 Body Mass Index (BMI) 40.0 and over, adult: Secondary | ICD-10-CM | POA: Diagnosis not present

## 2023-03-28 DIAGNOSIS — M2550 Pain in unspecified joint: Secondary | ICD-10-CM | POA: Diagnosis not present

## 2023-03-28 DIAGNOSIS — Z1331 Encounter for screening for depression: Secondary | ICD-10-CM | POA: Diagnosis not present

## 2023-04-04 DIAGNOSIS — Z6841 Body Mass Index (BMI) 40.0 and over, adult: Secondary | ICD-10-CM | POA: Diagnosis not present

## 2023-04-04 DIAGNOSIS — I10 Essential (primary) hypertension: Secondary | ICD-10-CM | POA: Diagnosis not present

## 2023-04-11 DIAGNOSIS — Z6841 Body Mass Index (BMI) 40.0 and over, adult: Secondary | ICD-10-CM | POA: Diagnosis not present

## 2023-04-11 DIAGNOSIS — E7800 Pure hypercholesterolemia, unspecified: Secondary | ICD-10-CM | POA: Diagnosis not present

## 2023-04-18 DIAGNOSIS — Z6841 Body Mass Index (BMI) 40.0 and over, adult: Secondary | ICD-10-CM | POA: Diagnosis not present

## 2023-04-18 DIAGNOSIS — M2550 Pain in unspecified joint: Secondary | ICD-10-CM | POA: Diagnosis not present

## 2023-04-22 ENCOUNTER — Other Ambulatory Visit: Payer: Self-pay | Admitting: Family Medicine

## 2023-04-22 DIAGNOSIS — I10 Essential (primary) hypertension: Secondary | ICD-10-CM

## 2023-04-22 DIAGNOSIS — R450 Nervousness: Secondary | ICD-10-CM

## 2023-04-22 DIAGNOSIS — G2581 Restless legs syndrome: Secondary | ICD-10-CM

## 2023-04-25 DIAGNOSIS — Z1231 Encounter for screening mammogram for malignant neoplasm of breast: Secondary | ICD-10-CM | POA: Diagnosis not present

## 2023-04-26 DIAGNOSIS — E559 Vitamin D deficiency, unspecified: Secondary | ICD-10-CM | POA: Diagnosis not present

## 2023-04-26 DIAGNOSIS — Z6841 Body Mass Index (BMI) 40.0 and over, adult: Secondary | ICD-10-CM | POA: Diagnosis not present

## 2023-04-26 DIAGNOSIS — M2550 Pain in unspecified joint: Secondary | ICD-10-CM | POA: Diagnosis not present

## 2023-05-04 DIAGNOSIS — Z6841 Body Mass Index (BMI) 40.0 and over, adult: Secondary | ICD-10-CM | POA: Diagnosis not present

## 2023-05-04 DIAGNOSIS — I10 Essential (primary) hypertension: Secondary | ICD-10-CM | POA: Diagnosis not present

## 2023-05-12 DIAGNOSIS — Z6841 Body Mass Index (BMI) 40.0 and over, adult: Secondary | ICD-10-CM | POA: Diagnosis not present

## 2023-05-12 DIAGNOSIS — E7800 Pure hypercholesterolemia, unspecified: Secondary | ICD-10-CM | POA: Diagnosis not present

## 2023-05-18 DIAGNOSIS — Z6841 Body Mass Index (BMI) 40.0 and over, adult: Secondary | ICD-10-CM | POA: Diagnosis not present

## 2023-05-18 DIAGNOSIS — I10 Essential (primary) hypertension: Secondary | ICD-10-CM | POA: Diagnosis not present

## 2023-05-25 DIAGNOSIS — Z6841 Body Mass Index (BMI) 40.0 and over, adult: Secondary | ICD-10-CM | POA: Diagnosis not present

## 2023-05-25 DIAGNOSIS — K219 Gastro-esophageal reflux disease without esophagitis: Secondary | ICD-10-CM | POA: Diagnosis not present

## 2023-06-09 DIAGNOSIS — I10 Essential (primary) hypertension: Secondary | ICD-10-CM | POA: Diagnosis not present

## 2023-06-09 DIAGNOSIS — Z6841 Body Mass Index (BMI) 40.0 and over, adult: Secondary | ICD-10-CM | POA: Diagnosis not present

## 2023-06-30 DIAGNOSIS — Z6841 Body Mass Index (BMI) 40.0 and over, adult: Secondary | ICD-10-CM | POA: Diagnosis not present

## 2023-06-30 DIAGNOSIS — K219 Gastro-esophageal reflux disease without esophagitis: Secondary | ICD-10-CM | POA: Diagnosis not present

## 2023-07-04 ENCOUNTER — Encounter: Payer: Self-pay | Admitting: Family Medicine

## 2023-07-14 DIAGNOSIS — Z6841 Body Mass Index (BMI) 40.0 and over, adult: Secondary | ICD-10-CM | POA: Diagnosis not present

## 2023-07-14 DIAGNOSIS — I10 Essential (primary) hypertension: Secondary | ICD-10-CM | POA: Diagnosis not present

## 2023-07-26 DIAGNOSIS — G2581 Restless legs syndrome: Secondary | ICD-10-CM | POA: Diagnosis not present

## 2023-07-27 DIAGNOSIS — Z6841 Body Mass Index (BMI) 40.0 and over, adult: Secondary | ICD-10-CM | POA: Diagnosis not present

## 2023-07-27 DIAGNOSIS — K219 Gastro-esophageal reflux disease without esophagitis: Secondary | ICD-10-CM | POA: Diagnosis not present

## 2023-08-04 ENCOUNTER — Other Ambulatory Visit: Payer: Self-pay | Admitting: Family Medicine

## 2023-08-08 ENCOUNTER — Other Ambulatory Visit: Payer: Self-pay | Admitting: Family Medicine

## 2023-08-09 ENCOUNTER — Ambulatory Visit: Admitting: Family Medicine

## 2023-08-10 DIAGNOSIS — I10 Essential (primary) hypertension: Secondary | ICD-10-CM | POA: Diagnosis not present

## 2023-08-10 DIAGNOSIS — Z6841 Body Mass Index (BMI) 40.0 and over, adult: Secondary | ICD-10-CM | POA: Diagnosis not present

## 2023-08-24 DIAGNOSIS — E7800 Pure hypercholesterolemia, unspecified: Secondary | ICD-10-CM | POA: Diagnosis not present

## 2023-09-01 DIAGNOSIS — G2581 Restless legs syndrome: Secondary | ICD-10-CM | POA: Diagnosis not present

## 2023-09-06 DIAGNOSIS — G2581 Restless legs syndrome: Secondary | ICD-10-CM | POA: Diagnosis not present

## 2023-09-06 DIAGNOSIS — E611 Iron deficiency: Secondary | ICD-10-CM | POA: Diagnosis not present

## 2023-09-20 DIAGNOSIS — E7800 Pure hypercholesterolemia, unspecified: Secondary | ICD-10-CM | POA: Diagnosis not present

## 2023-10-04 DIAGNOSIS — Z01419 Encounter for gynecological examination (general) (routine) without abnormal findings: Secondary | ICD-10-CM | POA: Diagnosis not present

## 2023-10-04 DIAGNOSIS — Z1151 Encounter for screening for human papillomavirus (HPV): Secondary | ICD-10-CM | POA: Diagnosis not present

## 2023-11-30 DIAGNOSIS — F5101 Primary insomnia: Secondary | ICD-10-CM | POA: Diagnosis not present

## 2023-11-30 DIAGNOSIS — R0989 Other specified symptoms and signs involving the circulatory and respiratory systems: Secondary | ICD-10-CM | POA: Diagnosis not present

## 2023-11-30 DIAGNOSIS — Z1330 Encounter for screening examination for mental health and behavioral disorders, unspecified: Secondary | ICD-10-CM | POA: Diagnosis not present

## 2023-11-30 DIAGNOSIS — G2581 Restless legs syndrome: Secondary | ICD-10-CM | POA: Diagnosis not present

## 2023-11-30 DIAGNOSIS — F419 Anxiety disorder, unspecified: Secondary | ICD-10-CM | POA: Diagnosis not present

## 2023-12-01 DIAGNOSIS — R0989 Other specified symptoms and signs involving the circulatory and respiratory systems: Secondary | ICD-10-CM | POA: Diagnosis not present

## 2024-01-03 ENCOUNTER — Other Ambulatory Visit: Payer: Self-pay | Admitting: Family Medicine

## 2024-04-05 ENCOUNTER — Other Ambulatory Visit: Payer: Self-pay | Admitting: Family Medicine

## 2024-04-05 DIAGNOSIS — I10 Essential (primary) hypertension: Secondary | ICD-10-CM

## 2024-04-05 DIAGNOSIS — G2581 Restless legs syndrome: Secondary | ICD-10-CM

## 2024-04-05 DIAGNOSIS — R450 Nervousness: Secondary | ICD-10-CM
